# Patient Record
Sex: Female | Born: 1970 | Race: Black or African American | Hispanic: No | Smoking: Current every day smoker
Health system: Southern US, Community
[De-identification: ages and names within clinical notes are randomized; demographics above are authoritative.]

## PROBLEM LIST (undated history)

## (undated) DIAGNOSIS — K219 Gastro-esophageal reflux disease without esophagitis: Secondary | ICD-10-CM

## (undated) DIAGNOSIS — R569 Unspecified convulsions: Secondary | ICD-10-CM

## (undated) DIAGNOSIS — G629 Polyneuropathy, unspecified: Secondary | ICD-10-CM

## (undated) DIAGNOSIS — D649 Anemia, unspecified: Secondary | ICD-10-CM

## (undated) DIAGNOSIS — S069X9A Unspecified intracranial injury with loss of consciousness of unspecified duration, initial encounter: Secondary | ICD-10-CM

## (undated) DIAGNOSIS — E119 Type 2 diabetes mellitus without complications: Secondary | ICD-10-CM

## (undated) DIAGNOSIS — J449 Chronic obstructive pulmonary disease, unspecified: Secondary | ICD-10-CM

## (undated) DIAGNOSIS — S069XAA Unspecified intracranial injury with loss of consciousness status unknown, initial encounter: Secondary | ICD-10-CM

## (undated) HISTORY — PX: KNEE SURGERY: SHX244

---

## 2016-11-13 ENCOUNTER — Emergency Department: Payer: Medicare HMO

## 2016-11-13 ENCOUNTER — Emergency Department
Admission: EM | Admit: 2016-11-13 | Discharge: 2016-11-13 | Disposition: A | Discharge status: Home / Self Care | Payer: Medicare HMO | Attending: Emergency Medicine | Admitting: Emergency Medicine

## 2016-11-13 ENCOUNTER — Encounter: Payer: Self-pay | Admitting: *Deleted

## 2016-11-13 DIAGNOSIS — M25461 Effusion, right knee: Secondary | ICD-10-CM | POA: Diagnosis not present

## 2016-11-13 DIAGNOSIS — M7551 Bursitis of right shoulder: Secondary | ICD-10-CM | POA: Diagnosis not present

## 2016-11-13 DIAGNOSIS — E119 Type 2 diabetes mellitus without complications: Secondary | ICD-10-CM | POA: Diagnosis not present

## 2016-11-13 DIAGNOSIS — M25561 Pain in right knee: Secondary | ICD-10-CM | POA: Diagnosis present

## 2016-11-13 DIAGNOSIS — F17200 Nicotine dependence, unspecified, uncomplicated: Secondary | ICD-10-CM | POA: Insufficient documentation

## 2016-11-13 DIAGNOSIS — J449 Chronic obstructive pulmonary disease, unspecified: Secondary | ICD-10-CM | POA: Diagnosis not present

## 2016-11-13 MED ORDER — NAPROXEN 500 MG PO TABS: 500.0000 mg | ORAL_TABLET | Freq: Two times a day (BID) | ORAL | 0 refills | Status: DC

## 2016-11-13 MED ORDER — NAPROXEN 500 MG PO TABS
500.0000 mg | ORAL_TABLET | Freq: Once | ORAL | Status: AC
  Administered 2016-11-13: 500 mg via ORAL
  Filled 2016-11-13: qty 1

## 2016-11-13 NOTE — ED Triage Notes (Signed)
Pt brought in via ems via wheelchair.  Pt has right knee pain.  No known recent injury.  No swelling.

## 2016-11-13 NOTE — ED Provider Notes (Signed)
[facility] Emergency Department Provider Note  ____________________________________________  Time seen: Approximately 8:18 PM  I have reviewed the triage vital signs and the nursing notes.   HISTORY  Chief Complaint Knee Pain    HPI Shelly Anderson is a 45 y.o. female , NAD, presents to the emergency department with right shoulder and right knee pain. States she has chronic arthritic pain but has noticed that the right knee pain has begun to worsen over the last day. Denies any injury, trauma or fall. Has had previous surgeries but they were in the remote past. Has not been taking anything over-the-counter for her pain. Has not noted any redness, swelling, warmth, skin sores. Denies any numbness, weakness, tingling. Has had no chest pain, shortness breath, abdominal pain, nausea, vomiting, fevers, chills or body aches. Does use a cane for ambulation.   No past medical history on file.  There are no active problems to display for this patient.   No past surgical history on file.  Prior to Admission medications   Medication Sig Start Date End Date Taking? Authorizing Provider  naproxen (NAPROSYN) 500 MG tablet Take 1 tablet (500 mg total) by mouth 2 (two) times daily with a meal. 11/13/16   Kedric Bumgarner L Janijah Symons, PA-C    Allergies Patient has no known allergies.  No family history on file.  Social History Social History  Substance Use Topics  . Smoking status: Current Every Day Smoker  . Smokeless tobacco: Never Used  . Alcohol use No     Review of Systems Constitutional: No fever/chills Cardiovascular: No chest pain. Respiratory:  No shortness of breath.  Gastrointestinal: No abdominal pain.  No nausea, vomiting.  Musculoskeletal: Positive right shoulder and right knee pain. Negative for back pain.  Skin: Negative for rash, redness, swelling, skin sores. Neurological: Negative for Numbness, weakness, tingling. 10-point ROS otherwise  negative.  ____________________________________________   PHYSICAL EXAM:  VITAL SIGNS: ED Triage Vitals  Enc Vitals Group     BP 11/13/16 1937 112/74     Pulse Rate 11/13/16 1937 85     Resp 11/13/16 1937 (!) 2     Temp 11/13/16 1937 98.9 F (37.2 C)     Temp Source 11/13/16 1937 Oral     SpO2 11/13/16 1937 96 %     Weight 11/13/16 1939 200 lb (90.7 kg)     Height 11/13/16 1939 5\' 2"  (1.575 m)     Head Circumference --      Peak Flow --      Pain Score 11/13/16 1939 10     Pain Loc --      Pain Edu? --      Excl. in GC? --      Constitutional: Alert and oriented. Well appearing and in no acute distress. Eyes: Conjunctivae are normal.  Head: Atraumatic. Cardiovascular: Good peripheral circulation with 2+ pulses noted in the right upper and lower extremities. Respiratory: Normal respiratory effort without tachypnea or retractions.  Musculoskeletal: Tenderness to palpation about the proximal, right lateral shoulder correlating with the bursa. Patient has full range of motion of the right shoulder without difficulty but pain with full abduction. Tenderness to palpation about the right medial and superior portion of the knee but with full range of motion. No laxity with anterior posterior drawer of the right knee. No laxity with varus and valgus stress. No lower extremity tenderness nor edema.  No joint effusions. Neurologic:  Normal speech and language. No gross focal neurologic deficits are  appreciated.  Skin:  Skin is warm, dry and intact. No rash, redness, swelling, bruising, open wounds or lacerations noted. Psychiatric: Mood and affect are normal. Speech and behavior are normal. Patient exhibits appropriate insight and judgement.   ____________________________________________   LABS  None ____________________________________________  EKG  None ____________________________________________  RADIOLOGY I, Hope PigeonJami L Tamea Bai, personally viewed and evaluated these images  (plain radiographs) as part of my medical decision making, as well as reviewing the written report by the radiologist.  Dg Knee Complete 4 Views Right  Result Date: 11/13/2016 CLINICAL DATA:  Right knee pain EXAM: RIGHT KNEE - COMPLETE 4+ VIEW COMPARISON:  None. FINDINGS: No fracture or malalignment. Moderate narrowing of the medial and lateral joint compartments. Mild narrowing the patellofemoral compartment. Small superior patellar spurring. Mild medial and lateral spurring. Trace suprapatellar effusion. IMPRESSION: 1. No acute osseous abnormality 2. Moderate arthritic changes 3. Suspect trace suprapatellar effusion Electronically Signed   By: Jasmine PangKim  Fujinaga M.D.   On: 11/13/2016 20:27    ____________________________________________    PROCEDURES  Procedure(s) performed: None   Procedures   Medications  naproxen (NAPROSYN) tablet 500 mg (500 mg Oral Given 11/13/16 2058)     ____________________________________________   INITIAL IMPRESSION / ASSESSMENT AND PLAN / ED COURSE  Pertinent labs & imaging results that were available during my care of the patient were reviewed by me and considered in my medical decision making (see chart for details).  Clinical Course     Patient's diagnosis is consistent with Effusion of right knee and acute bursitis of the right shoulder. Patient was given naproxen in the emergency department and tolerated well. Patient will be discharged home with prescriptions for naproxen to take as directed. Patient is to follow up with Dr. Hyacinth MeekerMiller in orthopedics if symptoms persist past this treatment course. Patient is given ED precautions to return to the ED for any worsening or new symptoms.    ____________________________________________  FINAL CLINICAL IMPRESSION(S) / ED DIAGNOSES  Final diagnoses:  Effusion of right knee  Acute bursitis of right shoulder      NEW MEDICATIONS STARTED DURING THIS VISIT:  Discharge Medication List as of 11/13/2016   8:49 PM    START taking these medications   Details  naproxen (NAPROSYN) 500 MG tablet Take 1 tablet (500 mg total) by mouth 2 (two) times daily with a meal., Starting Thu 11/13/2016, Print             Hope PigeonJami L Labresha Mellor, PA-C 11/13/16 2120    Jeanmarie PlantJames A McShane, MD 11/13/16 2359

## 2016-11-13 NOTE — ED Notes (Signed)
Patient transported to X-ray 

## 2016-11-15 ENCOUNTER — Emergency Department
Admission: EM | Admit: 2016-11-15 | Discharge: 2016-11-15 | Disposition: A | Discharge status: Home / Self Care | Payer: Medicare HMO | Attending: Emergency Medicine | Admitting: Emergency Medicine

## 2016-11-15 ENCOUNTER — Encounter: Payer: Self-pay | Admitting: Emergency Medicine

## 2016-11-15 DIAGNOSIS — M25461 Effusion, right knee: Secondary | ICD-10-CM | POA: Diagnosis not present

## 2016-11-15 DIAGNOSIS — M7551 Bursitis of right shoulder: Secondary | ICD-10-CM | POA: Diagnosis not present

## 2016-11-15 DIAGNOSIS — Z791 Long term (current) use of non-steroidal anti-inflammatories (NSAID): Secondary | ICD-10-CM | POA: Diagnosis not present

## 2016-11-15 DIAGNOSIS — J449 Chronic obstructive pulmonary disease, unspecified: Secondary | ICD-10-CM | POA: Insufficient documentation

## 2016-11-15 DIAGNOSIS — E119 Type 2 diabetes mellitus without complications: Secondary | ICD-10-CM | POA: Diagnosis not present

## 2016-11-15 DIAGNOSIS — F17200 Nicotine dependence, unspecified, uncomplicated: Secondary | ICD-10-CM | POA: Diagnosis not present

## 2016-11-15 DIAGNOSIS — M25561 Pain in right knee: Secondary | ICD-10-CM | POA: Diagnosis present

## 2016-11-15 HISTORY — DX: Chronic obstructive pulmonary disease, unspecified: J44.9

## 2016-11-15 HISTORY — DX: Gastro-esophageal reflux disease without esophagitis: K21.9

## 2016-11-15 HISTORY — DX: Type 2 diabetes mellitus without complications: E11.9

## 2016-11-15 HISTORY — DX: Polyneuropathy, unspecified: G62.9

## 2016-11-15 HISTORY — DX: Anemia, unspecified: D64.9

## 2016-11-15 MED ORDER — KETOROLAC TROMETHAMINE 30 MG/ML IJ SOLN
30.0000 mg | Freq: Once | INTRAMUSCULAR | Status: AC
  Administered 2016-11-15: 30 mg via INTRAMUSCULAR
  Filled 2016-11-15: qty 1

## 2016-11-15 NOTE — ED Provider Notes (Signed)
[facility] Emergency Department Provider Note  ____________________________________________  Time seen: Approximately 5:24 PM  I have reviewed the triage vital signs and the nursing notes.   HISTORY  Chief Complaint Knee Pain    HPI Shelly Anderson is a 45 y.o. female who presents to emergency department complaining of right knee and right shoulder pain. Patient was seen 2 days ago this department for same complaint. Patient was diagnosed with bursitis and right knee pain with small joint effusion. Patient reports that there is been no changes since then but "I don't want pills this time, I want a shot." Patient states that she has had 1 day worth of medications that she has been prescribed. She denies any injury or trauma to area. She denies any radicular symptoms to the upper or lower extremities. Patient states that she is "just here so I can get a shot."   Past Medical History:  Diagnosis Date  . Anemia   . COPD (chronic obstructive pulmonary disease) (HCC)   . Diabetes mellitus without complication (HCC)   . GERD (gastroesophageal reflux disease)   . Neuropathy (HCC)     There are no active problems to display for this patient.   History reviewed. No pertinent surgical history.  Prior to Admission medications   Medication Sig Start Date End Date Taking? Authorizing Provider  naproxen (NAPROSYN) 500 MG tablet Take 1 tablet (500 mg total) by mouth 2 (two) times daily with a meal. 11/13/16   Jami L Hagler, PA-C    Allergies Patient has no known allergies.  No family history on file.  Social History Social History  Substance Use Topics  . Smoking status: Current Every Day Smoker  . Smokeless tobacco: Never Used  . Alcohol use No     Review of Systems  Constitutional: No fever/chills Cardiovascular: no chest pain. Respiratory: no cough. No SOB. Musculoskeletal: Positive for right knee and right shoulder pain Skin: Negative for rash,  abrasions, lacerations, ecchymosis. Neurological: Negative for headaches, focal weakness or numbness. 10-point ROS otherwise negative.  ____________________________________________   PHYSICAL EXAM:  VITAL SIGNS: ED Triage Vitals  Enc Vitals Group     BP 11/15/16 1553 131/79     Pulse Rate 11/15/16 1553 90     Resp 11/15/16 1553 20     Temp 11/15/16 1553 97.9 F (36.6 C)     Temp Source 11/15/16 1553 Oral     SpO2 11/15/16 1553 95 %     Weight 11/15/16 1554 200 lb (90.7 kg)     Height 11/15/16 1554 5\' 2"  (1.575 m)     Head Circumference --      Peak Flow --      Pain Score 11/15/16 1554 6     Pain Loc --      Pain Edu? --      Excl. in GC? --      Constitutional: Alert and oriented. Well appearing and in no acute distress. Eyes: Conjunctivae are normal. PERRL. EOMI. Head: Atraumatic. Neck: No stridor.    Cardiovascular: Normal rate, regular rhythm. Normal S1 and S2.  Good peripheral circulation. Respiratory: Normal respiratory effort without tachypnea or retractions. Lungs CTAB. Good air entry to the bases with no decreased or absent breath sounds. Musculoskeletal: Full range of motion to all extremities. No gross deformities appreciated.Full range of motion to right shoulder. Patient is tender to palpation over the proximal humerus on the lateral aspect. No palpable abnormality. Radial pulse and sensation intact distally. No  visible deformity to the knee but inspection. Full range of motion. Patient is diffusely tender to palpation of the anterior knee. No point tenderness. No palpable abnormality. Varus, valgus, Lachman's is negative. Dorsalis pedis pulse intact. Sensation intact distally. Neurologic:  Normal speech and language. No gross focal neurologic deficits are appreciated.  Skin:  Skin is warm, dry and intact. No rash noted. Psychiatric: Mood and affect are normal. Speech and behavior are normal. Patient exhibits appropriate insight and  judgement.   ____________________________________________   LABS (all labs ordered are listed, but only abnormal results are displayed)  Labs Reviewed - No data to display ____________________________________________  EKG   ____________________________________________  RADIOLOGY   X-ray from 11/13/2016 is reviewed at this time  ____________________________________________    PROCEDURES  Procedure(s) performed:    Procedures    Medications  ketorolac (TORADOL) 30 MG/ML injection 30 mg (30 mg Intramuscular Given 11/15/16 1742)     ____________________________________________   INITIAL IMPRESSION / ASSESSMENT AND PLAN / ED COURSE  Pertinent labs & imaging results that were available during my care of the patient were reviewed by me and considered in my medical decision making (see chart for details).  Review of the [place] CSRS was performed in accordance of the [facility] prior to dispensing any controlled drugs.  Clinical Course     Patient's diagnosis is consistent with Right shoulder bursitis and left knee pain with small effusion. No changes from previous visit. No new trauma. Imaging from previous visit was reviewed. Patient is here "just to get a shot." Toradol shot is given in the emergency department. She will follow-up with orthopedics for further evaluation and management. Patient is to continue prescribed Naprosyn from previous visit..  Patient is given ED precautions to return to the ED for any worsening or new symptoms.     ____________________________________________  FINAL CLINICAL IMPRESSION(S) / ED DIAGNOSES  Final diagnoses:  Bursitis of right shoulder  Acute pain of right knee  Effusion of right knee      NEW MEDICATIONS STARTED DURING THIS VISIT:  New Prescriptions   No medications on file        This chart was dictated using voice recognition software/Dragon. Despite best efforts to proofread, errors can occur which can change the  meaning. Any change was purely unintentional.    Racheal PatchesJonathan D John Williamsen, PA-C 11/15/16 1747    Emily FilbertJonathan E Williams, MD 11/15/16 2257

## 2016-11-15 NOTE — ED Triage Notes (Signed)
C/o  Right knee pain.  Hx of surgery, brace on knee.

## 2016-11-15 NOTE — ED Notes (Signed)
Pt standing at the doorway asking for a nurse. States she wants chocolate milk and sandwich.

## 2016-11-15 NOTE — ED Notes (Signed)
Pt in nad. Here for chronic knee pain. States doesn't want rx, just wants a shot of toradol for pain.

## 2016-11-20 ENCOUNTER — Encounter: Payer: Self-pay | Admitting: *Deleted

## 2016-11-20 ENCOUNTER — Emergency Department
Admission: EM | Admit: 2016-11-20 | Discharge: 2016-11-20 | Disposition: A | Discharge status: Home / Self Care | Payer: Medicare HMO | Attending: Emergency Medicine | Admitting: Emergency Medicine

## 2016-11-20 DIAGNOSIS — M7551 Bursitis of right shoulder: Secondary | ICD-10-CM

## 2016-11-20 DIAGNOSIS — F17200 Nicotine dependence, unspecified, uncomplicated: Secondary | ICD-10-CM | POA: Insufficient documentation

## 2016-11-20 DIAGNOSIS — M25561 Pain in right knee: Secondary | ICD-10-CM

## 2016-11-20 DIAGNOSIS — E119 Type 2 diabetes mellitus without complications: Secondary | ICD-10-CM | POA: Diagnosis not present

## 2016-11-20 DIAGNOSIS — M25511 Pain in right shoulder: Secondary | ICD-10-CM | POA: Diagnosis present

## 2016-11-20 DIAGNOSIS — J449 Chronic obstructive pulmonary disease, unspecified: Secondary | ICD-10-CM | POA: Diagnosis not present

## 2016-11-20 MED ORDER — KETOROLAC TROMETHAMINE 30 MG/ML IJ SOLN
30.0000 mg | Freq: Once | INTRAMUSCULAR | Status: AC
  Administered 2016-11-20: 30 mg via INTRAMUSCULAR
  Filled 2016-11-20: qty 1

## 2016-11-20 NOTE — ED Notes (Signed)
Pt has right knee pain.  Pt wearing a brace.  And pt has right shoulder pain.  No new injury.  Pt states she was seen here recently.

## 2016-11-20 NOTE — ED Notes (Signed)
EMS pt to the lobby via wheelchair, right shoulder and leg pain , pain worsens with "washing dishes" , VSS , from home

## 2016-11-20 NOTE — ED Triage Notes (Signed)
Pt has right knee pain and right shoulder pain.   Pt brought in via ems from home   Pt had surgery on right knee and is wearing brace on right knee.  Pt alert.

## 2016-11-20 NOTE — ED Provider Notes (Signed)
[facility] Emergency Department Provider Note  ____________________________________________  Time seen: Approximately 5:09 PM  I have reviewed the triage vital signs and the nursing notes.   HISTORY  Chief Complaint Knee Pain and Shoulder Pain    HPI Shelly Anderson is a 45 y.o. female who presents to emergency department complaining of right shoulder and right knee pain. Patient presents to the ED via EMS for this complaint. This is the third visit patient has had for this complaint in the last week. Patient states that she has no new injury just "started hurting again." Patient is unsure whether she is taking her prescribed medication from previous visit. Patient states that the pain is the same as previous. Again, no new injury. Patient has not followed up with orthopedics as instructed. Patient is requesting "a shot."   Past Medical History:  Diagnosis Date  . Anemia   . COPD (chronic obstructive pulmonary disease) (HCC)   . Diabetes mellitus without complication (HCC)   . GERD (gastroesophageal reflux disease)   . Neuropathy (HCC)     There are no active problems to display for this patient.   No past surgical history on file.  Prior to Admission medications   Medication Sig Start Date End Date Taking? Authorizing Provider  naproxen (NAPROSYN) 500 MG tablet Take 1 tablet (500 mg total) by mouth 2 (two) times daily with a meal. 11/13/16   Jami L Hagler, PA-C    Allergies Patient has no known allergies.  No family history on file.  Social History Social History  Substance Use Topics  . Smoking status: Current Every Day Smoker  . Smokeless tobacco: Never Used  . Alcohol use No     Review of Systems  Constitutional: No fever/chills Cardiovascular: no chest pain. Respiratory: no cough. No SOB. Musculoskeletal: Positive for right knee and right shoulder pain with Skin: Negative for rash, abrasions, lacerations,  ecchymosis. Neurological: Negative for headaches, focal weakness or numbness. 10-point ROS otherwise negative.  ____________________________________________   PHYSICAL EXAM:  VITAL SIGNS: ED Triage Vitals  Enc Vitals Group     BP 11/20/16 1705 110/67     Pulse Rate 11/20/16 1702 92     Resp 11/20/16 1702 18     Temp 11/20/16 1702 99 F (37.2 C)     Temp Source 11/20/16 1702 Oral     SpO2 11/20/16 1702 99 %     Weight 11/20/16 1703 200 lb (90.7 kg)     Height 11/20/16 1703 5\' 2"  (1.575 m)     Head Circumference --      Peak Flow --      Pain Score 11/20/16 1703 10     Pain Loc --      Pain Edu? --      Excl. in GC? --      Constitutional: Alert and oriented. Well appearing and in no acute distress. Eyes: Conjunctivae are normal. PERRL. EOMI. Head: Atraumatic. Neck: No stridor.    Cardiovascular: Normal rate, regular rhythm. Normal S1 and S2.  Good peripheral circulation. Respiratory: Normal respiratory effort without tachypnea or retractions. Lungs CTAB. Good air entry to the bases with no decreased or absent breath sounds. Musculoskeletal: Full range of motion to all extremities. No gross deformities appreciated.No gross deformities to shoulder knee upon inspection. Equal range of motion right shoulder. Patient is diffusely tender to palpation over the lateral aspect of the shoulder. No palpable abnormality. Patient is tender over the greater trochanteric region. Radial pulse and  sensation intact distally. No visible deformity or edema noted to inspection to the knee. Full range of motion to knee. Patient is diffusely tender to palpation over the anterior aspect of the knee. No palpable abnormality. Dorsalis pedis pulse intact distally. Sensation intact distally. Neurologic:  Normal speech and language. No gross focal neurologic deficits are appreciated.  Skin:  Skin is warm, dry and intact. No rash noted. Psychiatric: Mood and affect are normal. Speech and behavior are normal.  Patient exhibits appropriate insight and judgement.   ____________________________________________   LABS (all labs ordered are listed, but only abnormal results are displayed)  Labs Reviewed - No data to display ____________________________________________  EKG   ____________________________________________  RADIOLOGY   X-ray is reviewed from 11/13/2016  ____________________________________________    PROCEDURES  Procedure(s) performed:    Procedures    Medications  ketorolac (TORADOL) 30 MG/ML injection 30 mg (not administered)     ____________________________________________   INITIAL IMPRESSION / ASSESSMENT AND PLAN / ED COURSE  Pertinent labs & imaging results that were available during my care of the patient were reviewed by me and considered in my medical decision making (see chart for details).  Review of the [place] CSRS was performed in accordance of the [facility] prior to dispensing any controlled drugs.  Clinical Course     Patient's diagnosis is consistent with Continued right shoulder bursitis and knee pain. This is the third visit for this complaint in the last week. Patient presents emergency department via EMS for this complaint. No new injury. Exam is reassuring. No indication for nuclear imaging. Patient is informed that she should follow-up with orthopedics  no new prescriptions are prescribed this time. Patient is given ED precautions to return to the ED for any worsening or new symptoms.     ____________________________________________  FINAL CLINICAL IMPRESSION(S) / ED DIAGNOSES  Final diagnoses:  Bursitis of right shoulder  Acute pain of right knee      NEW MEDICATIONS STARTED DURING THIS VISIT:  New Prescriptions   No medications on file        This chart was dictated using voice recognition software/Dragon. Despite best efforts to proofread, errors can occur which can change the meaning. Any change was purely  unintentional.    Racheal PatchesJonathan D Zackery Brine, PA-C 11/20/16 1713    Loleta Roseory Forbach, MD 11/20/16 1742

## 2016-11-28 MED ORDER — ACETAMINOPHEN 500 MG PO TABS
ORAL_TABLET | ORAL | Status: AC
  Filled 2016-11-28: qty 2

## 2016-11-30 ENCOUNTER — Emergency Department: Payer: Medicare HMO

## 2016-11-30 ENCOUNTER — Encounter: Payer: Self-pay | Admitting: Medical Oncology

## 2016-11-30 ENCOUNTER — Emergency Department
Admission: EM | Admit: 2016-11-30 | Discharge: 2016-11-30 | Disposition: A | Discharge status: Home / Self Care | Payer: Medicare HMO | Attending: Emergency Medicine | Admitting: Emergency Medicine

## 2016-11-30 DIAGNOSIS — Y929 Unspecified place or not applicable: Secondary | ICD-10-CM | POA: Diagnosis not present

## 2016-11-30 DIAGNOSIS — W19XXXA Unspecified fall, initial encounter: Secondary | ICD-10-CM

## 2016-11-30 DIAGNOSIS — J449 Chronic obstructive pulmonary disease, unspecified: Secondary | ICD-10-CM | POA: Diagnosis not present

## 2016-11-30 DIAGNOSIS — Y9389 Activity, other specified: Secondary | ICD-10-CM | POA: Diagnosis not present

## 2016-11-30 DIAGNOSIS — E119 Type 2 diabetes mellitus without complications: Secondary | ICD-10-CM | POA: Diagnosis not present

## 2016-11-30 DIAGNOSIS — F17200 Nicotine dependence, unspecified, uncomplicated: Secondary | ICD-10-CM | POA: Insufficient documentation

## 2016-11-30 DIAGNOSIS — W0110XA Fall on same level from slipping, tripping and stumbling with subsequent striking against unspecified object, initial encounter: Secondary | ICD-10-CM | POA: Insufficient documentation

## 2016-11-30 DIAGNOSIS — Z791 Long term (current) use of non-steroidal anti-inflammatories (NSAID): Secondary | ICD-10-CM | POA: Diagnosis not present

## 2016-11-30 DIAGNOSIS — H538 Other visual disturbances: Secondary | ICD-10-CM | POA: Diagnosis not present

## 2016-11-30 DIAGNOSIS — S0990XA Unspecified injury of head, initial encounter: Secondary | ICD-10-CM | POA: Diagnosis present

## 2016-11-30 DIAGNOSIS — Y999 Unspecified external cause status: Secondary | ICD-10-CM | POA: Diagnosis not present

## 2016-11-30 NOTE — ED Provider Notes (Signed)
[facility] Emergency Department Provider Note  ____________________________________________   First MD Initiated Contact with Patient 11/30/16 1345     (approximate)  I have reviewed the triage vital signs and the nursing notes.   HISTORY  Chief Complaint Fall    HPI Shelly Anderson is a 45 y.o. female accompanied by her daughter. Patient states that she fell while getting out of the shower today. She states that she hit her head but did not lose consciousness. Since the fall, she has experienced blurry vision but no nausea, vomiting or disorientation. According to patient's daughter, patient has a history of seizure disorder, suicide attempt and traumatic brain injury. Patient is not currently living with her family. Patient's daughter is paying for her to stay in a hotel. Patient does not currently have a headache.   Past Medical History:  Diagnosis Date  . Anemia   . COPD (chronic obstructive pulmonary disease) (HCC)   . Diabetes mellitus without complication (HCC)   . GERD (gastroesophageal reflux disease)   . Neuropathy (HCC)     There are no active problems to display for this patient.   History reviewed. No pertinent surgical history.  Prior to Admission medications   Medication Sig Start Date End Date Taking? Authorizing Provider  naproxen (NAPROSYN) 500 MG tablet Take 1 tablet (500 mg total) by mouth 2 (two) times daily with a meal. 11/13/16   Jami L Hagler, PA-C    Allergies Patient has no known allergies.  No family history on file.  Social History Social History  Substance Use Topics  . Smoking status: Current Every Day Smoker  . Smokeless tobacco: Never Used  . Alcohol use No    Review of Systems *Constitutional: No fever/chills Eyes: Blurry vision Cardiovascular: Denies chest pain. Respiratory: Denies shortness of breath. Gastrointestinal: No abdominal pain.  No nausea, no vomiting.  No diarrhea.  No  constipation. Musculoskeletal: Negative for back pain. Skin: Negative for rash. Neurological: Negative for headaches, focal weakness or numbness.  10-point ROS otherwise negative.  ____________________________________________   PHYSICAL EXAM:  VITAL SIGNS: ED Triage Vitals  Enc Vitals Group     BP 11/30/16 1208 113/79     Pulse Rate 11/30/16 1208 85     Resp 11/30/16 1208 16     Temp 11/30/16 1208 98.8 F (37.1 C)     Temp Source 11/30/16 1208 Oral     SpO2 11/30/16 1208 94 %     Weight 11/30/16 1159 200 lb (90.7 kg)     Height 11/30/16 1159 5\' 2"  (1.575 m)     Head Circumference --      Peak Flow --      Pain Score 11/30/16 1159 5     Pain Loc --      Pain Edu? --      Excl. in GC? --    Constitutional: Alert. Well appearing and in no acute distress. Eyes: Conjunctivae are normal. PERRL. EOMI.  Head: Atraumatic.No bruising or erythema visualized.  Nose: Nasal septum midline. No evidence of hematoma formation. Mouth/Throat: Mucous membranes are moist.  Oropharynx non-erythematous. Neck: Full range of motion. No tenderness along the C-spine. Cardiovascular: Normal rate, regular rhythm. Grossly normal heart sounds.  Good peripheral circulation. Respiratory: Normal respiratory effort.  No retractions. Lungs CTAB. Gastrointestinal: Soft and nontender. No distention. Neurologic: Normal speech and language. No gross focal neurologic deficits are appreciated. Cranial nerves: 2-10 normal as tested. Cerebellar: Finger-nose-finger WNL, heel to shin WNL. Sensorimotor: No pronator  drift, clonus, sensory loss or abnormal reflexes. Vision: No visual field deficts noted to confrontation.  Speech: No dysarthria or expressive aphasia.  Skin:  Skin is warm, dry and intact. No rash noted. Psychiatric: Patient has delay with answering questions.  ____________________________________________   LABS (all labs ordered are listed, but only abnormal results are displayed)  Labs Reviewed -  No data to display ____________________________________________   RADIOLOGY  I, Orvil FeilJaclyn M Batu Cassin, personally viewed and evaluated these images (plain radiographs) as part of my medical decision making, as well as reviewing the written report by the radiologist.    IMPRESSION:  1. No evidence of acute intracranial abnormality. No evidence of  calvarial fracture.  2. No cervical spine fracture or subluxation.    Procedures None    ____________________________________________   INITIAL IMPRESSION / ASSESSMENT AND PLAN / ED COURSE  Pertinent labs & imaging results that were available during my care of the patient were reviewed by me and considered in my medical decision making (see chart for details).   Clinical Course    Assessment and plan: Fall: Due to patient's complex past medical history consisting of a seizure disorder and traumatic brain injury, I felt a CT head and neck was warranted. No acute abnormalities were visualized on CT. Patient presents with no other complaints. Vital signs are reassuring at this time. Strict return precautions were given. All patient questions were answered. Tylenol was advised to be used as needed for discomfort. ____________________________________________   FINAL CLINICAL IMPRESSION(S) / ED DIAGNOSES  Final diagnoses:  Fall, initial encounter      NEW MEDICATIONS STARTED DURING THIS VISIT:  Discharge Medication List as of 11/30/2016  1:46 PM       Note:  This document was prepared using Dragon voice recognition software and may include unintentional dictation errors.    Orvil FeilJaclyn M Kieran Nachtigal, PA-C 11/30/16 1702    Sharyn CreamerMark Quale, MD 12/07/16 Burna Mortimer0010

## 2016-11-30 NOTE — NC FL2 (Signed)
  [place] MEDICAID FL2 LEVEL OF CARE SCREENING TOOL     IDENTIFICATION  Patient Name: Shelly AsalChristina D Deterding Birthdate: [birth year] Sex: female Admission Date (Current Location): 11/30/2016  [place]   Facility and Address:  [facility], [address], [place], [place] [address]      Provider Number: [id number]  Attending Physician Name and Address:  Sharyn CreamerMark Quale, MD  Relative Name and Phone Number:       Current Level of Care: Hospital Recommended Level of Care: Family Care Home Prior Approval Number:    Date Approved/Denied:   PASRR Number:    Discharge Plan: Other (Comment) (shelter)    Current Diagnoses: There are no active problems to display for this patient.   Orientation RESPIRATION BLADDER Height & Weight     Self, Time, Situation, Place  Normal Continent Weight: 200 lb (90.7 kg) Height:  5\' 2"  (157.5 cm)  BEHAVIORAL SYMPTOMS/MOOD NEUROLOGICAL BOWEL NUTRITION STATUS      Continent Diet (normal)  AMBULATORY STATUS COMMUNICATION OF NEEDS Skin   Independent (uses cane) Verbally Normal                       Personal Care Assistance Level of Assistance  Bathing, Feeding, Dressing, Total care Bathing Assistance: Independent Feeding assistance: Independent Dressing Assistance: Independent Total Care Assistance: Independent   Functional Limitations Info  Sight, Hearing, Speech Sight Info: Adequate Hearing Info: Adequate Speech Info: Adequate    SPECIAL CARE FACTORS FREQUENCY                       Contractures      Additional Factors Info  Code Status Code Status Info: Full             Current Medications (11/30/2016):  This is the current hospital active medication list No current facility-administered medications for this encounter.    Current Outpatient Prescriptions  Medication Sig Dispense Refill  . naproxen (NAPROSYN) 500 MG tablet Take 1 tablet (500 mg total) by mouth 2  (two) times daily with a meal. 14 tablet 0     Discharge Medications: Please see discharge summary for a list of discharge medications.  Relevant Imaging Results:  Relevant Lab Results:   Additional Information SSN # [id number][phone]  Tomie Spizzirri, [place] M, [place]

## 2016-11-30 NOTE — ED Triage Notes (Signed)
Pt slipped and fell getting out of shower, pt has TBI x 10 years and daughter concerned just wanting pt to be checked, per daughter pt acting baseline. Pt is not on blood thinner.

## 2016-11-30 NOTE — Progress Notes (Signed)
LCSW provided family with a list of [facility] and completed assessment and FL2 Patient has  APS worker coming tomorrow to meet with family and patient to assist with guardianship and home safety concerns and family issues.  Spoke to Dr Fanny BienQuale that Lavenia AtlasIve requested a signature to assist the [name]. He will review and sign.  LCSW spoke to ED Nurse and he will put signed copy of Fl2 in discharge summary  No further SW needs at this time.   [name] LCSW [phone]

## 2016-11-30 NOTE — Clinical Social Work Note (Addendum)
Clinical Social Work Assessment  Patient Details  Name: Shelly AsalChristina D Anderson MRN: [id number] Date of Birth: Jul 09, 1971  Date of referral:  11/30/16               Reason for consult:  Housing Concerns/Homelessness                Permission sought to share information with:  Family Supports, Guardian, Magazine features editoracility Contact Representative Permission granted to share information::  Yes, Verbal Permission Granted  Name::     Son/Corey [phone]  Agency::     Relationship::     Contact Information:     Housing/Transportation Living arrangements for the past 2 months:   (with family couch surfing) Source of Information:  Patient, Adult Children Patient Interpreter Needed:  None Criminal Activity/Legal Involvement Pertinent to Current Situation/Hospitalization:  No - Comment as needed Significant Relationships:  Adult Children Lives with:  Adult Children Do you feel safe going back to the place where you live?  Yes Need for family participation in patient care:  Yes (Comment)  Care giving concerns: [facility]   Social Worker assessment / plan: LCSW and patient was introduced. Patient gave verbal consent to talk to daughter/son and facilities if required. It was suggested that patient has no where to go and there was a recent family issue with false accusations. Patient has Schizophrenia,depression,PTSD and has to use a cane and has rotator cup issues. Patient is verbal but speech is mabled Patient is legally separated from her husband who is out of state. LCSW provided family with shelter information, List of family care facilities and [employer] resource list. Patient has an APS worker coming to the house over family issues and home safety concerns and guardianship.  Employment status:  Disabled (Comment on whether or not currently receiving Disability) ([employer]) Insurance information:  Furn[employer] conservator/restorerMedicare ([employer]) PT Recommendations:    Information / Referral to  community resources:     Patient/Family's Response to care:  Patient came in because of a fall  Patient/Family's Understanding of and Emotional Response to Diagnosis, Current Treatment, and Prognosis:  LCSW reviewed at length the process and will assist family with pts Fl2 for future placements  Emotional Assessment Appearance:  Appears stated age Attitude/Demeanor/Rapport:   (Calm- informed) Affect (typically observed):  Appropriate, Calm Orientation:  Oriented to Self, Oriented to Place, Oriented to  Time, Oriented to Situation Alcohol / Substance use:  Not Applicable Psych involvement (Current and /or in the community):   ([place] invol[place] in [place])  Discharge Needs  Concerns to be addressed:  Homelessness Readmission within the last 30 days:  No Current discharge risk:  Homeless, Psychiatric Illness, Physical Impairment Barriers to Discharge:  Continued Medical Work up, [employer] AutomotiveFamily Issues   Tashika Goodin, Adrianlaud[place] M, LCSW 11/30/2016, 1:42 PM

## 2016-12-10 ENCOUNTER — Other Ambulatory Visit: Payer: Self-pay | Admitting: Family Medicine

## 2016-12-10 ENCOUNTER — Ambulatory Visit
Admission: RE | Admit: 2016-12-10 | Discharge: 2016-12-10 | Disposition: A | Discharge status: Home / Self Care | Payer: Medicare HMO | Source: Ambulatory Visit | Attending: Family Medicine | Admitting: Family Medicine

## 2016-12-10 DIAGNOSIS — M25561 Pain in right knee: Secondary | ICD-10-CM

## 2016-12-10 DIAGNOSIS — M25562 Pain in left knee: Principal | ICD-10-CM

## 2016-12-10 DIAGNOSIS — M170 Bilateral primary osteoarthritis of knee: Secondary | ICD-10-CM | POA: Diagnosis not present

## 2016-12-10 DIAGNOSIS — G8929 Other chronic pain: Secondary | ICD-10-CM | POA: Diagnosis present

## 2017-08-21 ENCOUNTER — Emergency Department
Admission: EM | Admit: 2017-08-21 | Discharge: 2017-08-21 | Disposition: A | Discharge status: Home / Self Care | Payer: Medicare HMO

## 2017-08-21 NOTE — ED Triage Notes (Signed)
Called for [employer] Holdingsraige x1. Patient not in lobby

## 2017-08-25 ENCOUNTER — Telehealth: Payer: Self-pay | Admitting: Emergency Medicine

## 2017-08-25 NOTE — Telephone Encounter (Addendum)
Called patient due to lwot to inquire about condition and follow up plans. Left message with person who answered the phone.  She took my number.  Pt called me back.  She says she has not had anymore seizures.  I told her she could always return if needed.  She says she lives in a group home.

## 2017-09-02 ENCOUNTER — Other Ambulatory Visit: Payer: Self-pay | Admitting: Orthopedic Surgery

## 2017-09-02 ENCOUNTER — Ambulatory Visit
Admission: RE | Admit: 2017-09-02 | Discharge: 2017-09-02 | Disposition: A | Discharge status: Home / Self Care | Payer: Medicare HMO | Source: Ambulatory Visit | Attending: Orthopedic Surgery | Admitting: Orthopedic Surgery

## 2017-09-02 DIAGNOSIS — M25511 Pain in right shoulder: Principal | ICD-10-CM

## 2017-09-02 DIAGNOSIS — G8929 Other chronic pain: Secondary | ICD-10-CM

## 2017-09-02 DIAGNOSIS — M7531 Calcific tendinitis of right shoulder: Secondary | ICD-10-CM | POA: Insufficient documentation

## 2018-01-22 ENCOUNTER — Ambulatory Visit
Admission: RE | Admit: 2018-01-22 | Discharge: 2018-01-22 | Disposition: A | Discharge status: Home / Self Care | Payer: Medicare HMO | Source: Ambulatory Visit | Attending: Family Medicine | Admitting: Family Medicine

## 2018-01-22 ENCOUNTER — Emergency Department: Payer: Medicare HMO

## 2018-01-22 ENCOUNTER — Other Ambulatory Visit: Payer: Self-pay

## 2018-01-22 ENCOUNTER — Encounter: Payer: Self-pay | Admitting: Emergency Medicine

## 2018-01-22 ENCOUNTER — Other Ambulatory Visit: Payer: Self-pay | Admitting: Family Medicine

## 2018-01-22 ENCOUNTER — Emergency Department
Admission: EM | Admit: 2018-01-22 | Discharge: 2018-01-22 | Disposition: A | Discharge status: Home / Self Care | Payer: Medicare HMO | Attending: Emergency Medicine | Admitting: Emergency Medicine

## 2018-01-22 DIAGNOSIS — Y998 Other external cause status: Secondary | ICD-10-CM | POA: Insufficient documentation

## 2018-01-22 DIAGNOSIS — S43034A Inferior dislocation of right humerus, initial encounter: Secondary | ICD-10-CM

## 2018-01-22 DIAGNOSIS — S43111A Subluxation of right acromioclavicular joint, initial encounter: Secondary | ICD-10-CM | POA: Diagnosis not present

## 2018-01-22 DIAGNOSIS — Y929 Unspecified place or not applicable: Secondary | ICD-10-CM | POA: Insufficient documentation

## 2018-01-22 DIAGNOSIS — F17200 Nicotine dependence, unspecified, uncomplicated: Secondary | ICD-10-CM | POA: Diagnosis not present

## 2018-01-22 DIAGNOSIS — Z79899 Other long term (current) drug therapy: Secondary | ICD-10-CM | POA: Insufficient documentation

## 2018-01-22 DIAGNOSIS — Y9389 Activity, other specified: Secondary | ICD-10-CM | POA: Insufficient documentation

## 2018-01-22 DIAGNOSIS — S43001A Unspecified subluxation of right shoulder joint, initial encounter: Secondary | ICD-10-CM

## 2018-01-22 DIAGNOSIS — M25511 Pain in right shoulder: Secondary | ICD-10-CM

## 2018-01-22 DIAGNOSIS — X58XXXA Exposure to other specified factors, initial encounter: Secondary | ICD-10-CM

## 2018-01-22 DIAGNOSIS — J449 Chronic obstructive pulmonary disease, unspecified: Secondary | ICD-10-CM | POA: Diagnosis not present

## 2018-01-22 DIAGNOSIS — S4991XA Unspecified injury of right shoulder and upper arm, initial encounter: Secondary | ICD-10-CM | POA: Diagnosis present

## 2018-01-22 MED ORDER — MORPHINE SULFATE (PF) 4 MG/ML IV SOLN
4.0000 mg | Freq: Once | INTRAVENOUS | Status: AC
  Administered 2018-01-22: 4 mg via INTRAVENOUS
  Filled 2018-01-22: qty 1

## 2018-01-22 MED ORDER — PROPOFOL 10 MG/ML IV BOLUS: 0.5000 mg/kg | Freq: Once | INTRAVENOUS | Status: DC

## 2018-01-22 MED ORDER — PROPOFOL 10 MG/ML IV BOLUS: 1.0000 mg/kg | Freq: Once | INTRAVENOUS | Status: DC

## 2018-01-22 MED ORDER — KETOROLAC TROMETHAMINE 30 MG/ML IJ SOLN
30.0000 mg | Freq: Once | INTRAMUSCULAR | Status: AC
  Administered 2018-01-22: 30 mg via INTRAVENOUS

## 2018-01-22 MED ORDER — PROPOFOL 10 MG/ML IV BOLUS
0.5000 mg/kg | Freq: Once | INTRAVENOUS | Status: DC
Start: 2018-01-22 — End: 2018-01-22

## 2018-01-22 MED ORDER — OXYCODONE-ACETAMINOPHEN 5-325 MG PO TABS: 1.0000 | ORAL_TABLET | Freq: Four times a day (QID) | ORAL | 0 refills | Status: AC | PRN

## 2018-01-22 MED ORDER — PROPOFOL 10 MG/ML IV BOLUS
INTRAVENOUS | Status: AC | PRN
  Administered 2018-01-22: 80 mg via INTRAVENOUS

## 2018-01-22 MED ORDER — PROPOFOL 10 MG/ML IV BOLUS
40.0000 mg | Freq: Once | INTRAVENOUS | Status: DC
  Filled 2018-01-22: qty 20

## 2018-01-22 MED ORDER — KETOROLAC TROMETHAMINE 30 MG/ML IJ SOLN
INTRAMUSCULAR | Status: AC
  Administered 2018-01-22: 30 mg via INTRAVENOUS
  Filled 2018-01-22: qty 1

## 2018-01-22 MED ORDER — PROPOFOL 10 MG/ML IV BOLUS
80.0000 mg | Freq: Once | INTRAVENOUS | Status: AC
  Administered 2018-01-22: 80 mg via INTRAVENOUS

## 2018-01-22 NOTE — ED Notes (Signed)
Moderate sedation discharge instructions were included in discharge instructions from Dr. Marisa SeverinSiadecki. Patient signed moderate sedation discharge instructions paper that was witnessed by this Clinical research associatewriter and a copy was sent home with the patient.

## 2018-01-22 NOTE — ED Notes (Signed)
Patient called group home for a ride. This Clinical research associatewriter also called the group home who verified that someone was on their way here.

## 2018-01-22 NOTE — ED Notes (Addendum)
Right shoulder immobilizer placed by ED tech Beth. Patient given Ed sandwich tray.

## 2018-01-22 NOTE — ED Provider Notes (Signed)
[facility] Emergency Department Provider Note ____________________________________________   First MD Initiated Contact with Patient 01/22/18 1515     (approximate)  I have reviewed the triage vital signs and the nursing notes.   HISTORY  Chief Complaint Shoulder Pain    HPI Arnell AsalChristina D Gunderson is a 47 y.o. female with past medical history as noted below including a history of schizophrenia who presents with right shoulder pain over the last 2-3 days, atraumatic, constant, worse with any attempted movement, feeling similar to when she has dislocated her shoulder in the past.  Patient denies any numbness or weakness.  No other pain in other joints.  Past Medical History:  Diagnosis Date  . Anemia   . COPD (chronic obstructive pulmonary disease) (HCC)   . Diabetes mellitus without complication (HCC)   . GERD (gastroesophageal reflux disease)   . Neuropathy     There are no active problems to display for this patient.   History reviewed. No pertinent surgical history.  Prior to Admission medications   Medication Sig Start Date End Date Taking? Authorizing Provider  acetaminophen (TYLENOL) 650 MG CR tablet Take 650 mg by mouth every 8 (eight) hours as needed for pain.   Yes [provider]  atorvastatin (LIPITOR) 10 MG tablet Take 10 mg by mouth daily at 6 PM.   Yes [provider]  calcium carbonate (TUMS - DOSED IN MG ELEMENTAL CALCIUM) 500 MG chewable tablet Chew 1 tablet by mouth daily.   Yes [provider]  clotrimazole (LOTRIMIN) 1 % cream Apply 1 application topically 2 (two) times daily.   Yes [provider]  cyclobenzaprine (FLEXERIL) 10 MG tablet Take 10 mg by mouth at bedtime as needed for muscle spasms.   Yes [provider]  divalproex (DEPAKOTE) 250 MG DR tablet Take 250 mg by mouth 2 (two) times daily.   Yes [provider]  divalproex (DEPAKOTE) 500 MG DR tablet Take 500-1,000 mg by  mouth 2 (two) times daily.   Yes [provider]  DULoxetine (CYMBALTA) 60 MG capsule Take 60 mg by mouth daily.   Yes [provider]  fenofibrate (TRICOR) 145 MG tablet Take 145 mg by mouth daily.   Yes [provider]  fluticasone (FLONASE) 50 MCG/ACT nasal spray Place 1 spray into both nostrils daily.   Yes [provider]  hydrOXYzine (ATARAX/VISTARIL) 10 MG tablet Take 10 mg by mouth 3 (three) times daily as needed for itching.    Yes [provider]  hydrOXYzine (VISTARIL) 25 MG capsule Take 25 mg by mouth at bedtime.   Yes [provider]  ketotifen (ZADITOR) 0.025 % ophthalmic solution Place 1 drop into both eyes 2 (two) times daily as needed.   Yes [provider]  LORazepam (ATIVAN) 0.5 MG tablet Take 0.5 mg by mouth 2 (two) times daily.   Yes [provider]  LORazepam (ATIVAN) 0.5 MG tablet Take 0.5 mg by mouth daily as needed for anxiety.   Yes [provider]  Melatonin 3 MG TABS Take 3 mg by mouth daily.   Yes [provider]  meloxicam (MOBIC) 15 MG tablet Take 15 mg by mouth daily.   Yes [provider]  mirtazapine (REMERON) 7.5 MG tablet Take 7.5 mg by mouth at bedtime.   Yes [provider]  polyethylene glycol (MIRALAX / GLYCOLAX) packet Take 17 g by mouth daily as needed for mild constipation.    Yes [provider]  QUEtiapine (SEROQUEL XR) 300 MG 24 hr tablet Take 300 mg by mouth 2 (two) times daily.   Yes [provider]  naproxen (NAPROSYN) 500 MG tablet Take 1 tablet (500 mg total) by mouth 2 (two) times daily with a meal. Patient not taking: Reported on 01/22/2018 11/13/16   Hagler, Jami L, PA-C  oxyCODONE-acetaminophen (PERCOCET) 5-325 MG tablet Take 1 tablet by mouth every 6 (six) hours as needed for up to 3 days for severe pain. 01/22/18 01/25/18  Dionne Bucy, MD    Allergies Patient has no known allergies.  History reviewed. No  pertinent family history.  Social History Social History   Tobacco Use  . Smoking status: Current Every Day Smoker  . Smokeless tobacco: Never Used  Substance Use Topics  . Alcohol use: No  . Drug use: Not on file    Review of Systems  Constitutional: No fever. Eyes: No redness. ENT: No neck pain. Cardiovascular: Denies chest pain. Respiratory: Denies shortness of breath. Gastrointestinal: No abdominal pain.  Genitourinary: Negative for flank pain.  Musculoskeletal: Negative for back pain.  Positive for right shoulder pain. Skin: Negative for rash. Neurological: Negative for focal weakness or numbness.   ____________________________________________   PHYSICAL EXAM:  VITAL SIGNS: ED Triage Vitals  Enc Vitals Group     BP 01/22/18 1452 (!) 113/91     Pulse Rate 01/22/18 1452 96     Resp --      Temp 01/22/18 1452 98.5 F (36.9 C)     Temp Source 01/22/18 1452 Oral     SpO2 01/22/18 1452 97 %     Weight 01/22/18 1458 190 lb (86.2 kg)     Height 01/22/18 1458 5\' 2"  (1.575 m)     Head Circumference --      Peak Flow --      Pain Score 01/22/18 1458 8     Pain Loc --      Pain Edu? --      Excl. in GC? --     Constitutional: Alert and oriented. Well appearing and in no acute distress. Eyes: Conjunctivae are normal.  Head: Atraumatic. Nose: No congestion/rhinnorhea. Mouth/Throat: Mucous membranes are moist.   Neck: Normal range of motion.  Old tracheostomy scar present. Cardiovascular: Normal rate, regular rhythm. Grossly normal heart sounds.  Good peripheral circulation. Respiratory: Normal respiratory effort.  No retractions. Lungs CTAB. Gastrointestinal: No distention.  Genitourinary: No CVA tenderness. Musculoskeletal: No lower extremity edema.  Extremities warm and well perfused.  2+ DP pulse RUE.  FR OM right elbow and wrist.  Pain on any range of motion of right shoulder, and slight deformity to right shoulder. Neurologic:  Normal speech and language. No  gross focal neurologic deficits are appreciated.  5 out of 5 grip strength, and motor and sensory intact in median/radial/ulnar distributions of right upper extremity. Skin:  Skin is warm and dry. No rash noted. Psychiatric: Mood and affect are normal. Speech and behavior are normal.  ____________________________________________   LABS (all labs ordered are listed, but only abnormal results are displayed)  Labs Reviewed - No data to display ____________________________________________  EKG   ____________________________________________  RADIOLOGY  XR right shoulder: Subluxation of right shoulder joint  ____________________________________________   PROCEDURES  Procedure(s) performed: Yes  Reduction of dislocation Date/Time: 01/22/2018 6:12 PM Performed by: Dionne Bucy, MD Authorized by: Dionne Bucy, MD  Consent: Written consent obtained. Consent given by: patient Patient understanding: patient states understanding of the procedure being performed Patient consent: the  patient's understanding of the procedure matches consent given Procedure consent: procedure consent matches procedure scheduled Imaging studies: imaging studies available Required items: required blood products, implants, devices, and special equipment available Patient identity confirmed: verbally with patient and arm band Time out: Immediately prior to procedure a "time out" was called to verify the correct patient, procedure, equipment, support staff and site/side marked as required. Local anesthesia used: no  Anesthesia: Local anesthesia used: no  Sedation: Patient sedated: yes Sedatives: propofol Sedation start date/time: 01/22/2018 5:43 PM Vitals: Vital signs were monitored during sedation.  Patient tolerance: Patient tolerated the procedure well with no immediate complications  .Sedation Date/Time: 01/22/2018 6:14 PM Performed by: Dionne Bucy, MD Authorized by: Dionne Bucy, MD   Consent:    Consent obtained:  Written   Consent given by:  Patient   Risks discussed:  Prolonged hypoxia resulting in organ damage, prolonged sedation necessitating reversal, inadequate sedation and respiratory compromise necessitating ventilatory assistance and intubation Universal protocol:    Immediately prior to procedure a time out was called: yes   Indications:    Procedure performed:  Dislocation reduction   Procedure necessitating sedation performed by:  Physician performing sedation   Intended level of sedation:  Moderate (conscious sedation) Pre-sedation assessment:    Time since last food or drink:  6 hours   ASA classification: class 2 - patient with mild systemic disease     Neck mobility: normal     Mouth opening:  3 or more finger widths   Mallampati score:  II - soft palate, uvula, fauces visible   Pre-sedation assessments completed and reviewed: airway patency, mental status and respiratory function   Immediate pre-procedure details:    Reviewed: relevant labs/tests     Verified: bag valve mask available, emergency equipment available, intubation equipment available, oxygen available and suction available   Procedure details (see MAR for exact dosages):    Preoxygenation:  Nasal cannula   Sedation:  Propofol   Intra-procedure monitoring:  Continuous capnometry, blood pressure monitoring, cardiac monitor and continuous pulse oximetry   Intra-procedure events: none     Total Provider sedation time (minutes):  5 Post-procedure details:    Attendance: Constant attendance by certified staff until patient recovered     Recovery: Patient returned to pre-procedure baseline     Patient is stable for discharge or admission: yes     Patient tolerance:  Tolerated well, no immediate complications   Reduction of dislocation (Second attempt) Date/Time: 01/22/2018 8:15 PM Performed by: Dionne Bucy, MD Authorized by: Dionne Bucy, MD  Consent:  Written consent obtained. Consent given by: patient Patient understanding: patient states understanding of the procedure being performed Patient consent: the patient's understanding of the procedure matches consent given Procedure consent: procedure consent matches procedure scheduled Imaging studies: imaging studies available Required items: required blood products, implants, devices, and special equipment available Patient identity confirmed: verbally with patient and arm band Time out: Immediately prior to procedure a "time out" was called to verify the correct patient, procedure, equipment, support staff and site/side marked as required. Local anesthesia used: no  Anesthesia: Local anesthesia used: no  Sedation: Patient sedated: yes Sedatives: propofol Sedation start date/time: 01/22/2018 8:15 PM Vitals: Vital signs were monitored during sedation.  Patient tolerance: Patient tolerated the procedure well with no immediate complications  .Sedation (Second attempt) Date/Time: 01/22/2018 8:15 PM Performed by: Dionne Bucy, MD Authorized by: Dionne Bucy, MD   Consent:    Consent obtained:  Written   Consent given by:  Patient   Risks discussed:  Prolonged hypoxia resulting in organ damage, prolonged sedation necessitating reversal, inadequate sedation and respiratory compromise necessitating ventilatory assistance and intubation Indications:    Procedure performed:  Dislocation reduction   Procedure necessitating sedation performed by:  Physician performing sedation   Intended level of sedation:  Moderate (conscious sedation) Pre-sedation assessment:    Time since last food or drink:  6 hours   ASA classification: class 2 - patient with mild systemic disease     Neck mobility: normal     Mouth opening:  3 or more finger widths   Mallampati score:  II - soft palate, uvula, fauces visible   Pre-sedation assessments completed and reviewed: airway patency, mental status  and respiratory function   Immediate pre-procedure details:    Reviewed: relevant labs/tests     Verified: bag valve mask available, emergency equipment available, intubation equipment available, oxygen available and suction available   Procedure details (see MAR for exact dosages):    Preoxygenation:  Nasal cannula   Sedation:  Propofol   Intra-procedure monitoring:  Continuous capnometry, blood pressure monitoring, cardiac monitor and continuous pulse oximetry   Intra-procedure events: none     Total Provider sedation time (minutes):  5 Post-procedure details:    Attendance: Constant attendance by certified staff until patient recovered     Recovery: Patient returned to pre-procedure baseline     Patient is stable for discharge or admission: yes     Patient tolerance:  Tolerated well, no immediate complications     Critical Care performed: No ____________________________________________   INITIAL IMPRESSION / ASSESSMENT AND PLAN / ED COURSE  Pertinent labs & imaging results that were available during my care of the patient were reviewed by me and considered in my medical decision making (see chart for details).  47 year old female with past medical history as noted above presents with atraumatic right shoulder pain which she states is similar to prior shoulder dislocations.  She reports chronic pain in the right shoulder.  On exam, the patient has severe pain on any attempted range of motion of the right shoulder, and there is a deformity consistent with possible anterior dislocation.  The right upper extremity is neuro/vascular intact.  Patient has old tracheotomy scar which she states is from when she had an overdose 5 years ago.  There is no evidence of acute airway issue.  Although the patient lives in a group home, we confirmed that she does not have a guardian and independently makes her own medical decisions.  X-ray revealed subluxation of the right shoulder without frank  dislocation.  I reviewed the past medical records in epic.  Patient was previously evaluated by orthopedics in September 2018 for chronic shoulder pain, and an initial x-ray at that time showed a similar subluxation but when she had a subsequent CT the shoulder was in place.  I consulted orthopedics, and the consultant recommended normal procedure for reduction.  Based on the patient's severe pain with any movement of the shoulder, she will require moderate sedation.  Anticipate discharge home if the shoulder is properly reduced.  ----------------------------------------- 7:44 PM on 01/22/2018 -----------------------------------------  Reduction performed successfully with patient able to touch her opposite shoulder, and she was immediately placed in a sling, however while emerging from anesthesia, the patient repeatedly attempted to move the arm despite multiple attempts to restrain her, and was not compliant with being still after the procedure.  By the time that the x-ray was obtained the shoulder once again  appeared subluxed.    I consulted Dr. Allena Katz from orthopedics who had previously evaluated the patient, he recommended a second attempt in the ED given that the patient is still symptomatic, and that we should contact him again for further recommendations if it is not successful.  ----------------------------------------- 9:12 PM on 01/22/2018 -----------------------------------------  Shoulder reduction attempted a second time, and this time was successful.  Postreduction film confirms successfully reduced shoulder.  Based on recommendations per Dr. Allena Katz, patient can be safely discharged.  She is neuro/vascular intact after the procedure.  She will be placed in a shoulder immobilizer, and I have given her follow-up information for Dr. Allena Katz.  I gave the patient thorough return precautions, and she expresses understanding.     ____________________________________________   FINAL  CLINICAL IMPRESSION(S) / ED DIAGNOSES  Final diagnoses:  Subluxation of right shoulder joint, initial encounter      NEW MEDICATIONS STARTED DURING THIS VISIT:  New Prescriptions   OXYCODONE-ACETAMINOPHEN (PERCOCET) 5-325 MG TABLET    Take 1 tablet by mouth every 6 (six) hours as needed for up to 3 days for severe pain.     Note:  This document was prepared using Dragon voice recognition software and may include unintentional dictation errors.  Dionne Bucy, MD 01/22/18 2113

## 2018-01-22 NOTE — ED Triage Notes (Signed)
Pt with shoulder pain possibly for 2 days. Pt with hx of shoulder dislocation and pt says that this feels the same.

## 2018-01-22 NOTE — ED Notes (Signed)
Report received from Bill RN 

## 2018-01-22 NOTE — ED Notes (Signed)
Patient left with staff from group home.

## 2018-01-22 NOTE — Clinical Social Work Note (Signed)
CSW spoke to [facility] in [place], [place], [phone] who said patient is from the group home, and is her own legal guardian.  CSW contacted family care home owner Jimmye NormanLawanda Ray [phone] who confirmed patient is her own legal guardian.  Family care home said that patient also has a mother who patient is able to contact when she needs to.  CSW updated bedside nurse, with information that was given to CSW.  Demaree KnackEric R. Tion Tse, MSW, Theresia MajorsLCSWA [phone]  01/22/2018 4:54 PM

## 2018-01-22 NOTE — ED Notes (Signed)
Patient declined discharge vital signs. 

## 2018-01-22 NOTE — Discharge Instructions (Signed)
Return to the emergency department for new, worsening, recurrent pain in the right shoulder, recurrent dislocation of the right shoulder, weakness or numbness in the right arm, or any other new or worsening symptoms that concern you.  You should make an appointment to follow-up with Dr. Allena KatzPatel again within the next week.  Keep the sling on at all times except when showering.

## 2018-01-22 NOTE — ED Notes (Signed)
Patient signed consent for treatment. Patient states she is independent and does not have a healthcare power of attorney.

## 2018-01-22 NOTE — ED Notes (Signed)
X-ray at bedside

## 2018-01-22 NOTE — ED Notes (Signed)
[facility] from [facility], [place]

## 2018-02-21 ENCOUNTER — Emergency Department: Payer: Medicare HMO

## 2018-02-21 ENCOUNTER — Encounter: Payer: Self-pay | Admitting: *Deleted

## 2018-02-21 ENCOUNTER — Emergency Department
Admission: EM | Admit: 2018-02-21 | Discharge: 2018-02-21 | Disposition: A | Discharge status: Home / Self Care | Payer: Medicare HMO | Attending: Emergency Medicine | Admitting: Emergency Medicine

## 2018-02-21 DIAGNOSIS — R569 Unspecified convulsions: Secondary | ICD-10-CM | POA: Insufficient documentation

## 2018-02-21 DIAGNOSIS — E1140 Type 2 diabetes mellitus with diabetic neuropathy, unspecified: Secondary | ICD-10-CM | POA: Insufficient documentation

## 2018-02-21 DIAGNOSIS — Z79899 Other long term (current) drug therapy: Secondary | ICD-10-CM | POA: Insufficient documentation

## 2018-02-21 DIAGNOSIS — F17200 Nicotine dependence, unspecified, uncomplicated: Secondary | ICD-10-CM | POA: Insufficient documentation

## 2018-02-21 DIAGNOSIS — J449 Chronic obstructive pulmonary disease, unspecified: Secondary | ICD-10-CM | POA: Diagnosis not present

## 2018-02-21 HISTORY — DX: Unspecified convulsions: R56.9

## 2018-02-21 LAB — COMPREHENSIVE METABOLIC PANEL
ALT: 65 U/L — ABNORMAL HIGH (ref 14–54)
ANION GAP: 7 (ref 5–15)
AST: 91 U/L — ABNORMAL HIGH (ref 15–41)
Albumin: 3.3 g/dL — ABNORMAL LOW (ref 3.5–5.0)
Alkaline Phosphatase: 67 U/L (ref 38–126)
BILIRUBIN TOTAL: 0.7 mg/dL (ref 0.3–1.2)
BUN: 11 mg/dL (ref 6–20)
CO2: 25 mmol/L (ref 22–32)
Calcium: 9.4 mg/dL (ref 8.9–10.3)
Chloride: 105 mmol/L (ref 101–111)
Creatinine, Ser: 0.79 mg/dL (ref 0.44–1.00)
Glucose, Bld: 123 mg/dL — ABNORMAL HIGH (ref 65–99)
POTASSIUM: 4.6 mmol/L (ref 3.5–5.1)
Sodium: 137 mmol/L (ref 135–145)
Total Protein: 7.1 g/dL (ref 6.5–8.1)

## 2018-02-21 LAB — CBC
HEMATOCRIT: 37.3 % (ref 35.0–47.0)
Hemoglobin: 12.6 g/dL (ref 12.0–16.0)
MCH: 35.2 pg — ABNORMAL HIGH (ref 26.0–34.0)
MCHC: 33.7 g/dL (ref 32.0–36.0)
MCV: 104.5 fL — AB (ref 80.0–100.0)
Platelets: 327 10*3/uL (ref 150–440)
RBC: 3.57 MIL/uL — ABNORMAL LOW (ref 3.80–5.20)
RDW: 15.6 % — AB (ref 11.5–14.5)
WBC: 6.5 10*3/uL (ref 3.6–11.0)

## 2018-02-21 LAB — VALPROIC ACID LEVEL: VALPROIC ACID LVL: 79 ug/mL (ref 50.0–100.0)

## 2018-02-21 MED ORDER — IBUPROFEN 400 MG PO TABS
400.0000 mg | ORAL_TABLET | Freq: Once | ORAL | Status: AC
  Administered 2018-02-21: 400 mg via ORAL

## 2018-02-21 MED ORDER — IBUPROFEN 400 MG PO TABS
ORAL_TABLET | ORAL | Status: AC
  Administered 2018-02-21: 400 mg via ORAL
  Filled 2018-02-21: qty 1

## 2018-02-21 MED ORDER — ACETAMINOPHEN 500 MG PO TABS: 1000.0000 mg | ORAL_TABLET | Freq: Once | ORAL | Status: DC

## 2018-02-21 MED ORDER — ACETAMINOPHEN 500 MG PO TABS
ORAL_TABLET | ORAL | Status: AC
  Filled 2018-02-21: qty 2

## 2018-02-21 NOTE — ED Triage Notes (Signed)
[facility]. Per EMS report, the staff at the home witnessed a seizure lasting approx 30 sec. On arrival by EMS to the scene, the pt was alert and oriented, no signs of being post ictal. Pt reports hx of seizures, has a neurologist, but states not taking an medications for seizures, last seizure was 3 weeks ago.

## 2018-02-21 NOTE — ED Provider Notes (Signed)
[facility] Emergency Department Provider Note   ____________________________________________    I have reviewed the triage vital signs and the nursing notes.   HISTORY  Chief Complaint Seizures     HPI Shelly Anderson is a 47 y.o. female with a reported history of seizures, diabetes, COPD who presents after seizure apparently.  The history is somewhat vague, EMS reports the patient was joking and laughing upon their arrival, no postictal phase, no urinary incontinence.  The patient feels well she states that stress and anxiety can cause seizures.  There was a witness who said something like "she was moving funny ".  Review of records illustrates that the patient has followed up with Dr. Malvin Johns has had multiple normal EEGs, regardless she is on Depakote.   Past Medical History:  Diagnosis Date  . Anemia   . COPD (chronic obstructive pulmonary disease) (HCC)   . Diabetes mellitus without complication (HCC)   . GERD (gastroesophageal reflux disease)   . Neuropathy   . Seizures (HCC)     There are no active problems to display for this patient.   Past Surgical History:  Procedure Laterality Date  . KNEE SURGERY      Prior to Admission medications   Medication Sig Start Date End Date Taking? Authorizing Provider  acetaminophen (TYLENOL) 650 MG CR tablet Take 650 mg by mouth every 8 (eight) hours as needed for pain.    [provider]  atorvastatin (LIPITOR) 10 MG tablet Take 10 mg by mouth daily at 6 PM.    [provider]  calcium carbonate (TUMS - DOSED IN MG ELEMENTAL CALCIUM) 500 MG chewable tablet Chew 1 tablet by mouth daily.    [provider]  clotrimazole (LOTRIMIN) 1 % cream Apply 1 application topically 2 (two) times daily.    [provider]  cyclobenzaprine (FLEXERIL) 10 MG tablet Take 10 mg by mouth at bedtime as needed for muscle spasms.    [provider]  divalproex (DEPAKOTE) 250 MG  DR tablet Take 250 mg by mouth 2 (two) times daily.    [provider]  divalproex (DEPAKOTE) 500 MG DR tablet Take 500-1,000 mg by mouth 2 (two) times daily.    [provider]  DULoxetine (CYMBALTA) 60 MG capsule Take 60 mg by mouth daily.    [provider]  fenofibrate (TRICOR) 145 MG tablet Take 145 mg by mouth daily.    [provider]  fluticasone (FLONASE) 50 MCG/ACT nasal spray Place 1 spray into both nostrils daily.    [provider]  hydrOXYzine (ATARAX/VISTARIL) 10 MG tablet Take 10 mg by mouth 3 (three) times daily as needed for itching.     [provider]  hydrOXYzine (VISTARIL) 25 MG capsule Take 25 mg by mouth at bedtime.    [provider]  ketotifen (ZADITOR) 0.025 % ophthalmic solution Place 1 drop into both eyes 2 (two) times daily as needed.    [provider]  LORazepam (ATIVAN) 0.5 MG tablet Take 0.5 mg by mouth 2 (two) times daily.    [provider]  LORazepam (ATIVAN) 0.5 MG tablet Take 0.5 mg by mouth daily as needed for anxiety.    [provider]  Melatonin 3 MG TABS Take 3 mg by mouth daily.    [provider]  meloxicam (MOBIC) 15 MG tablet Take 15 mg by mouth daily.    [provider]  mirtazapine (REMERON) 7.5 MG tablet Take 7.5  mg by mouth at bedtime.    [provider]  naproxen (NAPROSYN) 500 MG tablet Take 1 tablet (500 mg total) by mouth 2 (two) times daily with a meal. Patient not taking: Reported on 01/22/2018 11/13/16   Hagler, Jami L, PA-C  polyethylene glycol (MIRALAX / GLYCOLAX) packet Take 17 g by mouth daily as needed for mild constipation.     [provider]  QUEtiapine (SEROQUEL XR) 300 MG 24 hr tablet Take 300 mg by mouth 2 (two) times daily.    [provider]     Allergies Patient has no known allergies.  No family history on file.  Social History Social History   Tobacco Use  . Smoking status: Current  Every Day Smoker  . Smokeless tobacco: Never Used  Substance Use Topics  . Alcohol use: No  . Drug use: Not on file    Review of Systems  Constitutional: No fever/chills Eyes: No visual changes.  ENT: No neck pain Cardiovascular: Denies chest pain. Respiratory: Denies shortness of breath.  Positive cough gastrointestinal: No abdominal pain.     Genitourinary: Negative for dysuria. Musculoskeletal: Chronic right shoulder pain Skin: Small bruise to the right thigh Neurological: Negative for headaches    ____________________________________________   PHYSICAL EXAM:  VITAL SIGNS: ED Triage Vitals  Enc Vitals Group     BP 02/21/18 1535 (!) 152/83     Pulse Rate 02/21/18 1535 (!) 108     Resp 02/21/18 1535 17     Temp 02/21/18 1535 98.2 F (36.8 C)     Temp Source 02/21/18 1535 Oral     SpO2 02/21/18 1535 97 %     Weight 02/21/18 1533 90.7 kg (200 lb)     Height 02/21/18 1533 1.575 m (5\' 2" )     Head Circumference --      Peak Flow --      Pain Score 02/21/18 1533 6     Pain Loc --      Pain Edu? --      Excl. in GC? --     Constitutional: Alert and oriented. No acute distress.  Eyes: Conjunctivae are normal.  Head: Atraumatic. Nose: No epistaxis or swelling Mouth/Throat: Mucous membranes are moist.    Cardiovascular: Normal rate, regular rhythm. Grossly normal heart sounds.  Good peripheral circulation. Respiratory: Normal respiratory effort.  No retractions. Lungs CTAB. Gastrointestinal: Soft and nontender. No distention.  No CVA tenderness. Genitourinary: deferred Musculoskeletal: Warm and well perfused Neurologic:  Normal speech and language. No gross focal neurologic deficits are appreciated.  Normal strength in all extremities Skin:  Skin is warm, dry and intact. No rash noted. Psychiatric: Mood and affect are normal. Speech and behavior are normal.  ____________________________________________   LABS (all labs ordered are listed, but only abnormal  results are displayed)  Labs Reviewed  CBC - Abnormal; Notable for the following components:      Result Value   RBC 3.57 (*)    MCV 104.5 (*)    MCH 35.2 (*)    RDW 15.6 (*)    All other components within normal limits  COMPREHENSIVE METABOLIC PANEL - Abnormal; Notable for the following components:   Glucose, Bld 123 (*)    Albumin 3.3 (*)    AST 91 (*)    ALT 65 (*)    All other components within normal limits  VALPROIC ACID LEVEL   ____________________________________________  EKG  ED ECG REPORT I, Jene Every, the attending physician, personally viewed and  interpreted this ECG.  Date: 02/21/2018  Rhythm: normal sinus rhythm QRS Axis: normal Intervals: normal ST/T Wave abnormalities: Nonspecific lateral T wave ab normalities   ____________________________________________  RADIOLOGY  None ____________________________________________   PROCEDURES  Procedure(s) performed: No  Procedures   Critical Care performed: No ____________________________________________   INITIAL IMPRESSION / ASSESSMENT AND PLAN / ED COURSE  Pertinent labs & imaging results that were available during my care of the patient were reviewed by me and considered in my medical decision making (see chart for details).  Patient well-appearing in no acute distress.  No postictal phase apparently, no urinary incontinence, no tongue laceration, history of chronic shoulder pain and has dislocated in the past but able to range it fully.  Unclear to me whether she did have a seizure or whether her seizures are possibly stress related.  We will check Depakote levels, insert IV, monitor carefully  ----------------------------------------- 5:55 PM on 02/21/2018 -----------------------------------------  Patient is requesting to leave.  Lab work is unremarkable, Depakote level is therapeutic, chest x-ray unremarkable.  No seizure activity while in the emergency department.  She agrees to  follow-up with her neurologist, return precautions discussed    ____________________________________________   FINAL CLINICAL IMPRESSION(S) / ED DIAGNOSES  Final diagnoses:  Seizure-like activity (HCC)        Note:  This document was prepared using Dragon voice recognition software and may include unintentional dictation errors.    Jene EveryKinner, Mariyam Remington, MD 02/21/18 1755

## 2018-02-21 NOTE — ED Notes (Signed)
Shelly Anderson called back and they are waiting on cab driver to return from another run, pt notified of delay.

## 2018-02-21 NOTE — ED Notes (Signed)
Pt informed MD ordered tylenol for headache. Pt states that tylenol does not work for her and request something else for headache.

## 2018-02-21 NOTE — ED Notes (Signed)
Pt given sprite and a turkey sandwich.  

## 2018-02-21 NOTE — ED Notes (Signed)
Pt ambulated to restroom without assistance.

## 2018-02-21 NOTE — ED Notes (Signed)
Shelly NormanLawanda Anderson returned call from [facility] home and request that we call [name] come for [facility] that they have contract with them. She will return to [address], [place], [place]

## 2018-02-21 NOTE — ED Notes (Signed)
[facility] called but no answer, message left to return call that pt needs to be picked up.  [phone]

## 2018-03-04 ENCOUNTER — Other Ambulatory Visit: Payer: Self-pay

## 2018-03-04 ENCOUNTER — Emergency Department (HOSPITAL_COMMUNITY)
Admission: EM | Admit: 2018-03-04 | Discharge: 2018-03-05 | Disposition: A | Discharge status: Home / Self Care | Payer: Medicare HMO | Attending: Emergency Medicine | Admitting: Emergency Medicine

## 2018-03-04 ENCOUNTER — Encounter (HOSPITAL_COMMUNITY): Payer: Self-pay

## 2018-03-04 DIAGNOSIS — R45851 Suicidal ideations: Secondary | ICD-10-CM | POA: Diagnosis not present

## 2018-03-04 DIAGNOSIS — F17210 Nicotine dependence, cigarettes, uncomplicated: Secondary | ICD-10-CM | POA: Diagnosis not present

## 2018-03-04 DIAGNOSIS — F32A Depression, unspecified: Secondary | ICD-10-CM

## 2018-03-04 DIAGNOSIS — E1140 Type 2 diabetes mellitus with diabetic neuropathy, unspecified: Secondary | ICD-10-CM | POA: Insufficient documentation

## 2018-03-04 DIAGNOSIS — J449 Chronic obstructive pulmonary disease, unspecified: Secondary | ICD-10-CM | POA: Insufficient documentation

## 2018-03-04 DIAGNOSIS — F329 Major depressive disorder, single episode, unspecified: Secondary | ICD-10-CM | POA: Diagnosis not present

## 2018-03-04 DIAGNOSIS — Z79899 Other long term (current) drug therapy: Secondary | ICD-10-CM | POA: Diagnosis not present

## 2018-03-04 LAB — COMPREHENSIVE METABOLIC PANEL
ALT: 42 U/L (ref 14–54)
ANION GAP: 9 (ref 5–15)
AST: 56 U/L — ABNORMAL HIGH (ref 15–41)
Albumin: 3 g/dL — ABNORMAL LOW (ref 3.5–5.0)
Alkaline Phosphatase: 73 U/L (ref 38–126)
BILIRUBIN TOTAL: 0.5 mg/dL (ref 0.3–1.2)
BUN: 22 mg/dL — AB (ref 6–20)
CO2: 23 mmol/L (ref 22–32)
Calcium: 8.9 mg/dL (ref 8.9–10.3)
Chloride: 105 mmol/L (ref 101–111)
Creatinine, Ser: 1.21 mg/dL — ABNORMAL HIGH (ref 0.44–1.00)
GFR calc non Af Amer: 54 mL/min — ABNORMAL LOW (ref >60)
Glucose, Bld: 109 mg/dL — ABNORMAL HIGH (ref 65–99)
POTASSIUM: 3.9 mmol/L (ref 3.5–5.1)
Sodium: 137 mmol/L (ref 135–145)
TOTAL PROTEIN: 6.6 g/dL (ref 6.5–8.1)

## 2018-03-04 LAB — RAPID URINE DRUG SCREEN, HOSP PERFORMED
Amphetamines: NOT DETECTED
BENZODIAZEPINES: POSITIVE — AB
Barbiturates: NOT DETECTED
Cocaine: NOT DETECTED
Opiates: NOT DETECTED
Tetrahydrocannabinol: POSITIVE — AB

## 2018-03-04 LAB — CBC WITH DIFFERENTIAL/PLATELET
Basophils Absolute: 0 10*3/uL (ref 0.0–0.1)
Basophils Relative: 0 %
EOS PCT: 1 %
Eosinophils Absolute: 0.1 10*3/uL (ref 0.0–0.7)
HCT: 35.1 % — ABNORMAL LOW (ref 36.0–46.0)
HEMOGLOBIN: 11.2 g/dL — AB (ref 12.0–15.0)
Lymphocytes Relative: 37 %
Lymphs Abs: 3.5 10*3/uL (ref 0.7–4.0)
MCH: 33.9 pg (ref 26.0–34.0)
MCHC: 31.9 g/dL (ref 30.0–36.0)
MCV: 106.4 fL — AB (ref 78.0–100.0)
Monocytes Absolute: 0.9 10*3/uL (ref 0.1–1.0)
Monocytes Relative: 10 %
Neutro Abs: 4.9 10*3/uL (ref 1.7–7.7)
Neutrophils Relative %: 52 %
PLATELETS: 367 10*3/uL (ref 150–400)
RBC: 3.3 MIL/uL — AB (ref 3.87–5.11)
RDW: 15.5 % (ref 11.5–15.5)
WBC: 9.4 10*3/uL (ref 4.0–10.5)

## 2018-03-04 LAB — I-STAT BETA HCG BLOOD, ED (MC, WL, AP ONLY): HCG, QUANTITATIVE: 5.5 m[IU]/mL — AB (ref <5)

## 2018-03-04 LAB — ETHANOL: Alcohol, Ethyl (B): <10 mg/dL (ref <10)

## 2018-03-04 NOTE — ED Triage Notes (Signed)
[facility] is [facility] [place], states she has been there for about a month and she doesn't like to stay there and is now suicidal.  Pt states she is "tired of being there because they are mean to me"  Pt admits to multiple self harm attempts in the past with cutting herself.   Pt is tearful
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
## 2018-03-05 ENCOUNTER — Encounter (HOSPITAL_COMMUNITY): Payer: Self-pay | Admitting: Registered Nurse

## 2018-03-05 DIAGNOSIS — F329 Major depressive disorder, single episode, unspecified: Secondary | ICD-10-CM | POA: Insufficient documentation

## 2018-03-05 DIAGNOSIS — R45851 Suicidal ideations: Secondary | ICD-10-CM | POA: Diagnosis not present

## 2018-03-05 DIAGNOSIS — F32A Depression, unspecified: Secondary | ICD-10-CM | POA: Insufficient documentation

## 2018-03-05 MED ORDER — LORAZEPAM 0.5 MG PO TABS
0.5000 mg | ORAL_TABLET | Freq: Two times a day (BID) | ORAL | Status: DC
  Administered 2018-03-05 (×2): 0.5 mg via ORAL
  Filled 2018-03-05 (×2): qty 1

## 2018-03-05 MED ORDER — IBUPROFEN 800 MG PO TABS
800.0000 mg | ORAL_TABLET | Freq: Once | ORAL | Status: AC
  Administered 2018-03-05: 800 mg via ORAL
  Filled 2018-03-05 (×2): qty 1

## 2018-03-05 MED ORDER — QUETIAPINE FUMARATE ER 300 MG PO TB24
300.0000 mg | ORAL_TABLET | Freq: Two times a day (BID) | ORAL | Status: DC
  Administered 2018-03-05: 300 mg via ORAL
  Filled 2018-03-05 (×8): qty 1

## 2018-03-05 MED ORDER — ATORVASTATIN CALCIUM 10 MG PO TABS
10.0000 mg | ORAL_TABLET | Freq: Every day | ORAL | Status: DC
  Filled 2018-03-05 (×3): qty 1

## 2018-03-05 MED ORDER — HYDROXYZINE HCL 10 MG PO TABS
10.0000 mg | ORAL_TABLET | Freq: Three times a day (TID) | ORAL | Status: DC | PRN
  Filled 2018-03-05: qty 1

## 2018-03-05 MED ORDER — MIRTAZAPINE 15 MG PO TABS
7.5000 mg | ORAL_TABLET | Freq: Every day | ORAL | Status: DC
  Filled 2018-03-05 (×3): qty 1

## 2018-03-05 MED ORDER — HYDROXYZINE HCL 25 MG PO TABS: 25.0000 mg | ORAL_TABLET | Freq: Every day | ORAL | Status: DC

## 2018-03-05 MED ORDER — DULOXETINE HCL 30 MG PO CPEP
60.0000 mg | ORAL_CAPSULE | Freq: Every day | ORAL | Status: DC
  Administered 2018-03-05 (×2): 60 mg via ORAL
  Filled 2018-03-05 (×2): qty 2

## 2018-03-05 MED ORDER — DIVALPROEX SODIUM 250 MG PO DR TAB
250.0000 mg | DELAYED_RELEASE_TABLET | Freq: Two times a day (BID) | ORAL | Status: DC
  Administered 2018-03-05: 250 mg via ORAL
  Filled 2018-03-05: qty 1

## 2018-03-05 MED ORDER — DIVALPROEX SODIUM 250 MG PO DR TAB
500.0000 mg | DELAYED_RELEASE_TABLET | Freq: Two times a day (BID) | ORAL | Status: DC
  Administered 2018-03-05: 500 mg via ORAL
  Administered 2018-03-05: 1000 mg via ORAL
  Filled 2018-03-05: qty 2
  Filled 2018-03-05: qty 4

## 2018-03-05 NOTE — [facility] Counselor (Signed)
Disposition:   Per Assunta FoundShuvon Rankin, NP, patient is psych-cleared. Neysa Bonitohristy, RN, was notified of patient disposition.

## 2018-03-05 NOTE — ED Notes (Signed)
Spoke with Michail Sermonavid Boswell concerning d/c and need follow up with FNP, has an appt on Wednesday already scheduled

## 2018-03-05 NOTE — ED Notes (Signed)
Pt refused Ibuprofen tablet at this time. [facility].

## 2018-03-05 NOTE — Clinical Social Work Note (Signed)
LCSW left a message for facility administrator, Jimmye NormanLawanda Ray, to discuss outpatient service provider. Message left at 11:59 a.m.    Dinesha Twiggs, Juleen ChinaHeather D, LCSW
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
## 2018-03-05 NOTE — Clinical Social Work Note (Signed)
LCSW spoke with Jimmye NormanLawanda Ray, facility administrator. She advised that patient [place] that comes to the facility to work with her.  LCSW advised that patient had been psych cleared and was ready for pick up.   LCSW signing off.

## 2018-03-05 NOTE — Discharge Instructions (Signed)
Follow up as directed by social work and behavioral health.

## 2018-03-05 NOTE — ED Notes (Signed)
Meal given to patient.  Pt calm and cooperative at this time.

## 2018-04-08 ENCOUNTER — Ambulatory Visit: Payer: Medicare HMO | Admitting: Podiatry

## 2018-04-19 ENCOUNTER — Ambulatory Visit: Payer: Medicare HMO | Admitting: Podiatry

## 2018-05-14 ENCOUNTER — Other Ambulatory Visit: Payer: Self-pay | Admitting: Family Medicine

## 2018-05-14 ENCOUNTER — Ambulatory Visit
Admission: RE | Admit: 2018-05-14 | Discharge: 2018-05-14 | Disposition: A | Discharge status: Home / Self Care | Payer: Medicare HMO | Source: Ambulatory Visit | Attending: Family Medicine | Admitting: Family Medicine

## 2018-05-14 DIAGNOSIS — M25561 Pain in right knee: Secondary | ICD-10-CM | POA: Insufficient documentation

## 2018-05-14 DIAGNOSIS — M25562 Pain in left knee: Principal | ICD-10-CM

## 2018-05-14 DIAGNOSIS — M238X2 Other internal derangements of left knee: Secondary | ICD-10-CM | POA: Diagnosis not present

## 2018-05-14 DIAGNOSIS — M25461 Effusion, right knee: Secondary | ICD-10-CM | POA: Insufficient documentation

## 2018-05-14 DIAGNOSIS — M1711 Unilateral primary osteoarthritis, right knee: Secondary | ICD-10-CM | POA: Diagnosis not present

## 2018-06-15 ENCOUNTER — Emergency Department: Payer: Medicare HMO

## 2018-06-15 ENCOUNTER — Emergency Department
Admission: EM | Admit: 2018-06-15 | Discharge: 2018-06-15 | Disposition: A | Discharge status: Home / Self Care | Payer: Medicare HMO | Attending: Emergency Medicine | Admitting: Emergency Medicine

## 2018-06-15 ENCOUNTER — Encounter: Payer: Self-pay | Admitting: Emergency Medicine

## 2018-06-15 ENCOUNTER — Other Ambulatory Visit: Payer: Self-pay

## 2018-06-15 DIAGNOSIS — F329 Major depressive disorder, single episode, unspecified: Secondary | ICD-10-CM | POA: Insufficient documentation

## 2018-06-15 DIAGNOSIS — F17200 Nicotine dependence, unspecified, uncomplicated: Secondary | ICD-10-CM | POA: Insufficient documentation

## 2018-06-15 DIAGNOSIS — Y929 Unspecified place or not applicable: Secondary | ICD-10-CM | POA: Insufficient documentation

## 2018-06-15 DIAGNOSIS — W19XXXA Unspecified fall, initial encounter: Secondary | ICD-10-CM

## 2018-06-15 DIAGNOSIS — S4991XA Unspecified injury of right shoulder and upper arm, initial encounter: Secondary | ICD-10-CM | POA: Diagnosis present

## 2018-06-15 DIAGNOSIS — E119 Type 2 diabetes mellitus without complications: Secondary | ICD-10-CM | POA: Insufficient documentation

## 2018-06-15 DIAGNOSIS — Y9389 Activity, other specified: Secondary | ICD-10-CM | POA: Diagnosis not present

## 2018-06-15 DIAGNOSIS — M24419 Recurrent dislocation, unspecified shoulder: Secondary | ICD-10-CM

## 2018-06-15 DIAGNOSIS — J449 Chronic obstructive pulmonary disease, unspecified: Secondary | ICD-10-CM | POA: Insufficient documentation

## 2018-06-15 DIAGNOSIS — Z79899 Other long term (current) drug therapy: Secondary | ICD-10-CM | POA: Diagnosis not present

## 2018-06-15 DIAGNOSIS — W01198A Fall on same level from slipping, tripping and stumbling with subsequent striking against other object, initial encounter: Secondary | ICD-10-CM | POA: Insufficient documentation

## 2018-06-15 DIAGNOSIS — Y998 Other external cause status: Secondary | ICD-10-CM | POA: Diagnosis not present

## 2018-06-15 DIAGNOSIS — M24411 Recurrent dislocation, right shoulder: Secondary | ICD-10-CM | POA: Diagnosis not present

## 2018-06-15 MED ORDER — OXYCODONE HCL 5 MG PO TABS
5.0000 mg | ORAL_TABLET | Freq: Once | ORAL | Status: AC
  Administered 2018-06-15: 5 mg via ORAL
  Filled 2018-06-15: qty 1

## 2018-06-15 MED ORDER — SILVER SULFADIAZINE 1 % EX CREA
TOPICAL_CREAM | Freq: Two times a day (BID) | CUTANEOUS | Status: DC
  Administered 2018-06-15: 17:00:00 via TOPICAL

## 2018-06-15 MED ORDER — FLUMAZENIL 0.5 MG/5ML IV SOLN
INTRAVENOUS | Status: AC
  Filled 2018-06-15: qty 5

## 2018-06-15 MED ORDER — MIDAZOLAM HCL 5 MG/5ML IJ SOLN
INTRAMUSCULAR | Status: AC | PRN
  Administered 2018-06-15 (×4): 2 mg via INTRAVENOUS

## 2018-06-15 MED ORDER — FENTANYL CITRATE (PF) 100 MCG/2ML IJ SOLN
INTRAMUSCULAR | Status: AC
  Filled 2018-06-15: qty 4

## 2018-06-15 MED ORDER — NALOXONE HCL 2 MG/2ML IJ SOSY
PREFILLED_SYRINGE | INTRAMUSCULAR | Status: AC
  Filled 2018-06-15: qty 2

## 2018-06-15 MED ORDER — SILVER SULFADIAZINE 1 % EX CREA
TOPICAL_CREAM | CUTANEOUS | Status: AC
  Filled 2018-06-15: qty 85

## 2018-06-15 MED ORDER — MIDAZOLAM HCL 5 MG/5ML IJ SOLN
INTRAMUSCULAR | Status: AC
  Filled 2018-06-15: qty 10

## 2018-06-15 MED ORDER — OXYCODONE-ACETAMINOPHEN 5-325 MG PO TABS: 1.0000 | ORAL_TABLET | Freq: Four times a day (QID) | ORAL | 0 refills | Status: DC | PRN

## 2018-06-15 MED ORDER — FENTANYL CITRATE (PF) 100 MCG/2ML IJ SOLN
INTRAMUSCULAR | Status: AC | PRN
  Administered 2018-06-15 (×2): 50 ug via INTRAVENOUS
  Administered 2018-06-15: 25 ug via INTRAVENOUS

## 2018-06-15 NOTE — Discharge Instructions (Signed)
Please wear the shoulder immobilizer.  Please follow-up with Dr. [facility].  Give his office a call in the morning to arrange follow-up within a week.  Use Tylenol or Advil as needed for pain.  Per severe pain you can use the Percocet I gave you 1 up to 4 times a day.  Return for any new symptoms or if your shoulder goes out of joint again.

## 2018-06-15 NOTE — ED Notes (Signed)
Patient waiting in room for ride.

## 2018-06-15 NOTE — ED Notes (Signed)
Patient's ride on their way to pick up patient.
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
## 2018-06-15 NOTE — ED Triage Notes (Signed)
Pt reports she stood up to get her cane and the chair broke and she fell hurting her right shoulder. No swelling or deformity seen.

## 2018-06-15 NOTE — ED Notes (Signed)
Patient was eating and drinking

## 2018-06-15 NOTE — ED Provider Notes (Addendum)
[facility] Emergency Department Provider Note   ____________________________________________   First MD Initiated Contact with Patient 06/15/18 1811     (approximate)  I have reviewed the triage vital signs and the nursing notes.   HISTORY  Chief Complaint Shoulder Injury and Fall    HPI Shelly Anderson is a 47 y.o. female who was leaning on a chair that broke she fell and hit her shoulder.  This happened on Thursday.  She continues to have pain.  X-ray done in fast track showed a shoulder dislocation.  She was transferred to the main side for shoulder relocation.  I obtained oral consent from the patient and then written consent.  Patient was sedated with Versed and fentanyl and the shoulder was manipulated with reduction of the shoulder.  Patient was neurovascularly intact before and after.   Past Medical History:  Diagnosis Date  . Anemia   . COPD (chronic obstructive pulmonary disease) (HCC)   . Diabetes mellitus without complication (HCC)   . GERD (gastroesophageal reflux disease)   . Neuropathy   . Seizures [facility])     Patient Active Problem List   Diagnosis Date Noted  . Suicidal ideation 03/05/2018  . Depression     Past Surgical History:  Procedure Laterality Date  . KNEE SURGERY      Prior to Admission medications   Medication Sig Start Date End Date Taking? Authorizing Provider  acetaminophen (TYLENOL) 500 MG tablet Take 1,000 mg by mouth every 8 (eight) hours as needed for mild pain.    [provider]  albuterol (PROVENTIL HFA;VENTOLIN HFA) 108 (90 Base) MCG/ACT inhaler Inhale 1-2 puffs into the lungs every 6 (six) hours as needed for wheezing or shortness of breath.    [provider]  atorvastatin (LIPITOR) 40 MG tablet Take 40 mg by mouth daily at 6 PM.     [provider]  calcium carbonate (TUMS - DOSED IN MG ELEMENTAL CALCIUM) 500 MG chewable tablet Chew 1 tablet by mouth 2 (two) times daily.      [provider]  clotrimazole (LOTRIMIN) 1 % cream Apply 1 application topically 2 (two) times daily.    [provider]  cyclobenzaprine (FLEXERIL) 10 MG tablet Take 10 mg by mouth at bedtime as needed for muscle spasms.    [provider]  dextromethorphan-guaiFENesin (ROBITUSSIN-DM) 10-100 MG/5ML liquid Take 5 mLs by mouth every 4 (four) hours as needed for cough.    [provider]  divalproex (DEPAKOTE) 250 MG DR tablet Take 250 mg by mouth 2 (two) times daily.    [provider]  divalproex (DEPAKOTE) 500 MG DR tablet Take 500-1,000 mg by mouth 2 (two) times daily.    [provider]  DULoxetine (CYMBALTA) 60 MG capsule Take 60 mg by mouth daily.    [provider]  fenofibrate (TRICOR) 145 MG tablet Take 145 mg by mouth daily.    [provider]  fluticasone (FLONASE) 50 MCG/ACT nasal spray Place 1 spray into both nostrils daily.    [provider]  hydrOXYzine (VISTARIL) 25 MG capsule Take 25 mg by mouth at bedtime.    [provider]  LORazepam (ATIVAN) 0.5 MG tablet Take 0.5 mg by mouth 2 (two) times daily.     [provider]  Melatonin 3 MG TABS Take 3 mg by mouth daily.    [provider]  meloxicam (MOBIC) 15 MG tablet Take 15 mg by mouth daily.  [provider]  mirtazapine (REMERON) 15 MG tablet Take 7.5 mg by mouth at bedtime.    [provider]  oxyCODONE-acetaminophen (PERCOCET) 5-325 MG tablet Take 1 tablet by mouth every 6 (six) hours as needed for severe pain. 06/15/18 06/15/19  Arnaldo Natal, MD  polyethylene glycol [facility] / Ethelene Hal) packet Take 17 g by mouth daily as needed for mild constipation.     [provider]  QUEtiapine (SEROQUEL XR) 300 MG 24 hr tablet Take 300 mg by mouth 2 (two) times daily.    [provider]  traMADol (ULTRAM) 50 MG tablet Take 50 mg by mouth daily as needed for moderate pain.    [provider]    Allergies Bee venom; Coconut oil; Eggs or egg-derived products; Penicillin g; and Phenytoin sodium extended  History reviewed. No pertinent family history.  Social History Social History   Tobacco Use  . Smoking status: Current Every Day Smoker  . Smokeless tobacco: Never Used  Substance Use Topics  . Alcohol use: No  . Drug use: Not on file    Review of Systems  Constitutional: No fever/chills Eyes: No visual changes. ENT: No sore throat. Cardiovascular: Denies chest pain. Respiratory: Denies shortness of breath. Gastrointestinal: No abdominal pain.  No nausea, no vomiting.  No diarrhea.  No constipation. Genitourinary: Negative for dysuria. Musculoskeletal: Negative for back pain. Skin: Negative for rash. Neurological: Negative for headaches, focal weakness ____________________________________________   PHYSICAL EXAM:  VITAL SIGNS: ED Triage Vitals  Enc Vitals Group     BP 06/15/18 1610 (!) 108/56     Pulse Rate 06/15/18 1610 94     Resp 06/15/18 1610 20     Temp 06/15/18 1610 98.1 F (36.7 C)     Temp Source 06/15/18 1610 Oral     SpO2 06/15/18 1610 96 %     Weight 06/15/18 1614 195 lb (88.5 kg)     Height 06/15/18 1614 5\' 2"  (1.575 m)     Head Circumference --      Peak Flow --      Pain Score 06/15/18 1613 10     Pain Loc --      Pain Edu? --      Excl. in GC? --     Constitutional: Alert and oriented. Well appearing and in no acute distress. Eyes: Conjunctivae are normal.  Head: Atraumatic. Nose: No congestion/rhinnorhea. Mouth/Throat: Mucous membranes are moist.  Oropharynx non-erythematous. Neck: No stridor.  Scar from tracheostomy present  cardiovascular: Normal rate, regular rhythm. Grossly normal heart sounds.  Good peripheral circulation. Respiratory: Normal respiratory effort.  No retractions. Lungs CTAB. Gastrointestinal: Soft and nontender. No distention. No abdominal bruits. No CVA tenderness. Musculoskeletal: No lower  extremity tenderness nor edema.  No joint effusions. Neurologic:  Normal speech and language. No gross focal neurologic deficits are appreciated. No gait instability. Skin:  Skin is warm, dry and intact. No rash noted. Psychiatric: Mood and affect are normal. Speech and behavior are normal.  ____________________________________________   LABS (all labs ordered are listed, but only abnormal results are displayed)  Labs Reviewed - No data to display ____________________________________________  EKG  ____________________________________________  RADIOLOGY  ED MD interpretation: Initial x-ray shows dislocation shoulder post reduction view appears to be reduced to me.  Official radiology report(s): Dg Shoulder Right  Result Date: 06/15/2018 CLINICAL DATA:  Pain status post fall 4 days ago. EXAM: RIGHT SHOULDER - 2+ VIEW COMPARISON:  01/22/2018 FINDINGS: Anterior subcoracoid humeral head dislocation is  identified. Rim of osteophytes noted about the humeral head. Soft tissue calcification consistent with rotator cuff tendinopathy is also noted. [facility]. The adjacent ribs and lung are nonacute. Surgical clip projects over the right axilla. IMPRESSION: 1. Anterior subcoracoid right humeral head dislocation. 2. Osteoarthritis of the right glenohumeral joint. 3. Calcific rotator cuff tendinopathy is noted. Electronically Signed   By: Tollie Ethavid  Kwon M.D.   On: 06/15/2018 17:59   Dg Shoulder Right Portable  Result Date: 06/15/2018 CLINICAL DATA:  Post reduction radiographs of the right shoulder. EXAM: PORTABLE RIGHT SHOULDER COMPARISON:  Pre reduction radiographs of the right shoulder from earlier on the same day. FINDINGS: Satisfactory reduction of the right humeral head relative to the glenoid. Small Hill-Sachs deformity is suggested. No bony Bankart lesion is noted. Osteoarthritic spurring and joint space narrowing is seen of the glenohumeral joint with subchondral cystic change of  the glenoid and rim of osteophytes noted about the humeral head-neck junction. Soft tissue calcification along the expected course of the rotator cuff is identified consistent with calcific rotator cuff tendinopathy. The adjacent right ribs and lung to the extent included are unremarkable. IMPRESSION: Satisfactory reduction of the right humeral head. No bony Bankart lesion is noted. There appears to be small Hill-Sachs deformity of the superolateral humeral head Calcific rotator cuff tendinopathy of the right shoulder. Electronically Signed   By: Tollie Ethavid  Kwon M.D.   On: 06/15/2018 19:24    ____________________________________________   PROCEDURES  Procedure(s) performed: See H&P for shoulder relocation note  .Sedation Date/Time: 07/12/2018 7:28 AM Performed by: Arnaldo NatalMalinda, Renardo Cheatum F, MD Authorized by: Arnaldo NatalMalinda, Yuritzi Kamp F, MD   Consent:    Consent obtained:  Verbal and written (electronic informed consent)   Consent given by:  Patient   Alternatives discussed:  Analgesia without sedation Universal protocol:    Procedure explained and questions answered to patient or proxy's satisfaction: yes     Relevant documents present and verified: yes     Test results available and properly labeled: yes     Imaging studies available: yes     Required blood products, implants, devices, and special equipment available: yes     Site/side marked: yes     Immediately prior to procedure a time out was called: yes     Patient identity confirmation method:  Arm band and verbally with patient Indications:    Procedure performed:  Dislocation reduction   Procedure necessitating sedation performed by:  Physician performing sedation   Intended level of sedation:  Moderate (conscious sedation) Pre-sedation assessment:    Time since last food or drink:  Over 6 hrs   ASA classification: class 2 - patient with mild systemic disease     Neck mobility: normal     Mouth opening:  3 or more finger widths   Thyromental  distance:  2 finger widths   Mallampati score:  II - soft palate, uvula, fauces visible   Pre-sedation assessments completed and reviewed: airway patency, cardiovascular function, hydration status, mental status, nausea/vomiting, pain level, respiratory function and temperature   Immediate pre-procedure details:    Verified: bag valve mask available, emergency equipment available, intubation equipment available, IV patency confirmed, oxygen available, reversal medications available and suction available   Procedure details (see MAR for exact dosages):    Preoxygenation:  Nasal cannula   Sedation:  Midazolam   Analgesia:  Fentanyl   Intra-procedure monitoring:  Blood pressure monitoring, continuous pulse oximetry, cardiac monitor, frequent vital sign checks, frequent LOC assessments  and continuous capnometry   Intra-procedure events: none     Total Provider sedation time (minutes):  30 Post-procedure details:    Post-sedation assessment completed:  06/15/2018 7:28 PM   Attendance: Constant attendance by certified staff until patient recovered     Recovery: Patient returned to pre-procedure baseline     Post-sedation assessments completed and reviewed: airway patency, cardiovascular function, hydration status, mental status and respiratory function     Patient is stable for discharge or admission: yes     Patient tolerance:  Tolerated well, no immediate complications Comments:     All times are approximate     Critical Care performed:   ____________________________________________   INITIAL IMPRESSION / ASSESSMENT AND PLAN / ED COURSE When patient wakes up we will discharge her with a shoulder immobilizer she can follow-up with Dr. Ernest Pine or her regular orthopedic surgeon.         ____________________________________________   FINAL CLINICAL IMPRESSION(S) / ED DIAGNOSES  Final diagnoses:  Fall, initial encounter  Shoulder dislocation, recurrent, unspecified laterality      ED Discharge Orders        Ordered    oxyCODONE-acetaminophen (PERCOCET) 5-325 MG tablet  Every 6 hours PRN     06/15/18 1924       Note:  This document was prepared using Dragon voice recognition software and may include unintentional dictation errors.    Arnaldo Natal, MD 06/15/18 1928  .procdoc    Arnaldo Natal, MD 07/12/18 [phone]

## 2018-06-15 NOTE — ED Notes (Signed)
See triage note  States she went to reach for her cane and fell  Landed on right side  Having pain to right shoulder  Limit movement d/t pain

## 2018-06-15 NOTE — ED Notes (Signed)
FIRST NURSE NOTE: R shoulder pain after fall Thursday. VSS with EMS.

## 2018-06-15 NOTE — ED Notes (Signed)
Patient was out walking in the hallway asking when she could leave.

## 2018-10-22 ENCOUNTER — Emergency Department
Admission: EM | Admit: 2018-10-22 | Discharge: 2018-10-23 | Disposition: A | Discharge status: Home / Self Care | Payer: Medicare HMO | Attending: Emergency Medicine | Admitting: Emergency Medicine

## 2018-10-22 DIAGNOSIS — Y9289 Other specified places as the place of occurrence of the external cause: Secondary | ICD-10-CM | POA: Insufficient documentation

## 2018-10-22 DIAGNOSIS — S60812A Abrasion of left wrist, initial encounter: Secondary | ICD-10-CM | POA: Insufficient documentation

## 2018-10-22 DIAGNOSIS — E119 Type 2 diabetes mellitus without complications: Secondary | ICD-10-CM | POA: Diagnosis not present

## 2018-10-22 DIAGNOSIS — R45851 Suicidal ideations: Secondary | ICD-10-CM | POA: Diagnosis not present

## 2018-10-22 DIAGNOSIS — Y999 Unspecified external cause status: Secondary | ICD-10-CM | POA: Insufficient documentation

## 2018-10-22 DIAGNOSIS — F339 Major depressive disorder, recurrent, unspecified: Secondary | ICD-10-CM

## 2018-10-22 DIAGNOSIS — Z79899 Other long term (current) drug therapy: Secondary | ICD-10-CM | POA: Diagnosis not present

## 2018-10-22 DIAGNOSIS — Y9389 Activity, other specified: Secondary | ICD-10-CM | POA: Diagnosis not present

## 2018-10-22 DIAGNOSIS — N390 Urinary tract infection, site not specified: Secondary | ICD-10-CM | POA: Diagnosis not present

## 2018-10-22 DIAGNOSIS — X788XXA Intentional self-harm by other sharp object, initial encounter: Secondary | ICD-10-CM | POA: Insufficient documentation

## 2018-10-22 DIAGNOSIS — R51 Headache: Secondary | ICD-10-CM | POA: Diagnosis not present

## 2018-10-22 DIAGNOSIS — F329 Major depressive disorder, single episode, unspecified: Secondary | ICD-10-CM | POA: Insufficient documentation

## 2018-10-22 DIAGNOSIS — Z008 Encounter for other general examination: Secondary | ICD-10-CM | POA: Diagnosis not present

## 2018-10-22 DIAGNOSIS — F17200 Nicotine dependence, unspecified, uncomplicated: Secondary | ICD-10-CM | POA: Insufficient documentation

## 2018-10-22 DIAGNOSIS — J449 Chronic obstructive pulmonary disease, unspecified: Secondary | ICD-10-CM | POA: Insufficient documentation

## 2018-10-22 DIAGNOSIS — T148XXA Other injury of unspecified body region, initial encounter: Secondary | ICD-10-CM

## 2018-10-22 NOTE — ED Provider Notes (Signed)
[facility] Emergency Department Provider Note  ____________________________________________   First MD Initiated Contact with Patient 10/22/18 2359     (approximate)  I have reviewed the triage vital signs and the nursing notes.   HISTORY  Chief Complaint Suicidal   HPI Shelly Anderson is a 47 y.o. female who comes to the emergency department via EMS after she cut her left wrist and a suicidal gesture shortly prior to arrival.  The patient lives at a group home and says that she has a long-standing history of depression for which she sees a psychiatrist.  Today she found out that her uncle died and it caused her an increasing amount of stress.  She took a knife that she found and cut her left wrist trying to hurt herself.  She says she is never attempted suicide in the past.  She is not sure if she wants to continue to live.  Her depression became acutely worse about an hour prior to arrival.  It was worsened by interpersonal stress of the family death.  Nothing seems to make it better.  She says she is allergic to tetanus shots.  Someone at her group home called 911 to have the patient evaluated.  She currently is reporting  a mild gradual onset not maximal onset bifrontal throbbing headache similar to previous headaches and is asking for ibuprofen.   Past Medical History:  Diagnosis Date  . Anemia   . COPD (chronic obstructive pulmonary disease) (HCC)   . Diabetes mellitus without complication (HCC)   . GERD (gastroesophageal reflux disease)   . Neuropathy   . Seizures [facility])     Patient Active Problem List   Diagnosis Date Noted  . Suicidal ideation 03/05/2018  . Depression     Past Surgical History:  Procedure Laterality Date  . KNEE SURGERY      Prior to Admission medications   Medication Sig Start Date End Date Taking? Authorizing Provider  acetaminophen (TYLENOL) 500 MG tablet Take 1,000 mg by mouth every 8 (eight) hours as needed for mild  pain.    [provider]  albuterol (PROVENTIL HFA;VENTOLIN HFA) 108 (90 Base) MCG/ACT inhaler Inhale 1-2 puffs into the lungs every 6 (six) hours as needed for wheezing or shortness of breath.    [provider]  atorvastatin (LIPITOR) 40 MG tablet Take 40 mg by mouth daily at 6 PM.     [provider]  calcium carbonate (TUMS - DOSED IN MG ELEMENTAL CALCIUM) 500 MG chewable tablet Chew 1 tablet by mouth 2 (two) times daily.     [provider]  clotrimazole (LOTRIMIN) 1 % cream Apply 1 application topically 2 (two) times daily.    [provider]  cyclobenzaprine (FLEXERIL) 10 MG tablet Take 10 mg by mouth at bedtime as needed for muscle spasms.    [provider]  dextromethorphan-guaiFENesin (ROBITUSSIN-DM) 10-100 MG/5ML liquid Take 5 mLs by mouth every 4 (four) hours as needed for cough.    [provider]  divalproex (DEPAKOTE) 250 MG DR tablet Take 250 mg by mouth 2 (two) times daily.    [provider]  divalproex (DEPAKOTE) 500 MG DR tablet Take 500-1,000 mg by mouth 2 (two) times daily.    [provider]  DULoxetine (CYMBALTA) 60 MG capsule Take 60 mg by mouth daily.    [provider]  fenofibrate (TRICOR) 145 MG tablet Take 145 mg by mouth daily.    [provider]  fluticasone (FLONASE) 50 MCG/ACT nasal spray Place 1 spray into both nostrils daily.    [provider]  hydrOXYzine (VISTARIL) 25 MG capsule Take 25 mg by mouth at bedtime.    [provider]  LORazepam (ATIVAN) 0.5 MG tablet Take 0.5 mg by mouth 2 (two) times daily.     [provider]  Melatonin 3 MG TABS Take 3 mg by mouth daily.    [provider]  meloxicam (MOBIC) 15 MG tablet Take 15 mg by mouth daily.    [provider]  mirtazapine (REMERON) 15 MG tablet Take 7.5 mg by mouth at bedtime.    [provider]  oxyCODONE-acetaminophen (PERCOCET) 5-325 MG tablet Take 1  tablet by mouth every 6 (six) hours as needed for severe pain. 06/15/18 06/15/19  Arnaldo Natal, MD  polyethylene glycol [facility] / Ethelene Hal) packet Take 17 g by mouth daily as needed for mild constipation.     [provider]  QUEtiapine (SEROQUEL XR) 300 MG 24 hr tablet Take 300 mg by mouth 2 (two) times daily.    [provider]  traMADol (ULTRAM) 50 MG tablet Take 50 mg by mouth daily as needed for moderate pain.    [provider]    Allergies Bee venom; Coconut oil; Eggs or egg-derived products; Penicillin g; and Phenytoin sodium extended  History reviewed. No pertinent family history.  Social History Social History   Tobacco Use  . Smoking status: Current Every Day Smoker  . Smokeless tobacco: Never Used  Substance Use Topics  . Alcohol use: No  . Drug use: Not on file    Review of Systems Constitutional: No fever/chills Eyes: No visual changes. ENT: No sore throat. Cardiovascular: Denies chest pain. Respiratory: Denies shortness of breath. Gastrointestinal: No abdominal pain.  No nausea, no vomiting.  No diarrhea.  No constipation. Genitourinary: Negative for dysuria. Musculoskeletal: Negative for back pain. Skin: Positive for wound Neurological: Positive for headache   ____________________________________________   PHYSICAL EXAM:  VITAL SIGNS: ED Triage Vitals  Enc Vitals Group     BP      Pulse      Resp      Temp      Temp src      SpO2      Weight      Height      Head Circumference      Peak Flow      Pain Score      Pain Loc      Pain Edu?      Excl. in GC?     Constitutional: Alert and oriented x4 sad affect nontoxic no diaphoresis Eyes: PERRL EOMI. midrange and brisk Head: Atraumatic. Nose: No congestion/rhinnorhea. Mouth/Throat: No trismus Neck: No stridor.   Cardiovascular: Normal rate, regular rhythm. Grossly normal heart sounds.  Good peripheral circulation. Respiratory: Normal respiratory effort.  No  retractions. Lungs CTAB and moving good air Gastrointestinal: Soft nontender Musculoskeletal: No tenderness over distal radius or distal ulna. No tenderness over snuffbox and no axial load discomfort Sensation intact to light touch over first dorsal webspace, distal index finger, distal small finger Can flex and oppose  thumb, cross 2 on 3, and extend wrist 2+ radial pulse and less than 2 second capillary refill Skin with slight abrasion as below. Compartments are soft  Neurologic:  Normal speech and language. No gross focal neurologic deficits are appreciated. Skin: Superficial abrasions to left volar wrist.  Please see photo below.  Nothing  that would require repair. Psychiatric: Mood and affect are normal. Speech and behavior are normal.      ____________________________________________   DIFFERENTIAL includes but not limited to  Laceration, tendon damage, suicide attempt, suicidal gesture, depression ____________________________________________   LABS (all labs ordered are listed, but only abnormal results are displayed)  Labs Reviewed  COMPREHENSIVE METABOLIC PANEL - Abnormal; Notable for the following components:      Result Value   Glucose, Bld 114 (*)    BUN 28 (*)    Albumin 3.2 (*)    All other components within normal limits  ACETAMINOPHEN LEVEL - Abnormal; Notable for the following components:   Acetaminophen (Tylenol), Serum <10 (*)    All other components within normal limits  CBC WITH DIFFERENTIAL/PLATELET - Abnormal; Notable for the following components:   RBC 3.26 (*)    Hemoglobin 11.1 (*)    HCT 33.9 (*)    MCV 104.0 (*)    All other components within normal limits  URINALYSIS, COMPLETE (UACMP) WITH MICROSCOPIC - Abnormal; Notable for the following components:   Color, Urine YELLOW (*)    APPearance CLOUDY (*)    Leukocytes, UA TRACE (*)    Bacteria, UA RARE (*)    All other components within normal limits  URINE DRUG SCREEN, QUALITATIVE ([facility] ONLY)  - Abnormal; Notable for the following components:   Tricyclic, Ur Screen POSITIVE (*)    MDMA (Ecstasy)Ur Screen POSITIVE (*)    Benzodiazepine, Ur Scrn POSITIVE (*)    All other components within normal limits  ETHANOL  SALICYLATE LEVEL    Lab work reviewed by me shows the patient is positive for MDMA, benzodiazepines, and try cyclic's.  Otherwise unremarkable __________________________________________  EKG   ____________________________________________  RADIOLOGY   ____________________________________________   PROCEDURES  Procedure(s) performed: no  Procedures  Critical Care performed: no  ____________________________________________   INITIAL IMPRESSION / ASSESSMENT AND PLAN / ED COURSE  Pertinent labs & imaging results that were available during my care of the patient were reviewed by me and considered in my medical decision making (see chart for details).   As part of my medical decision making, I reviewed the following data within the electronic MEDICAL RECORD NUMBER History obtained from family if available, nursing notes, old chart and ekg, as well as notes from prior ED visits.  The patient comes to the emergency department voluntarily with a very superficial laceration to her left wrist.  She is allergic to tetanus shots.  This wound does not require repair and she is neurovascularly intact.  At this time she is medically stable for psychiatric evaluation.    ----------------------------------------- 6:31 AM on 10/23/2018 -----------------------------------------  Specialist on-call finds no indication for inpatient psychiatric stabilization and recommends outpatient management.  Patient verbalizes understanding and agreement with the plan.  ____________________________________________   FINAL CLINICAL IMPRESSION(S) / ED DIAGNOSES  Final diagnoses:  Recurrent major depressive disorder, remission status unspecified (HCC)  Abrasion  Suicidal ideation       NEW MEDICATIONS STARTED DURING THIS VISIT:  New Prescriptions   No medications on file     Note:  This document was prepared using Dragon voice recognition software and may include unintentional dictation errors.     Merrily Brittle, MD 10/23/18 [phone]

## 2018-10-23 ENCOUNTER — Encounter: Payer: Self-pay | Admitting: Emergency Medicine

## 2018-10-23 DIAGNOSIS — F329 Major depressive disorder, single episode, unspecified: Secondary | ICD-10-CM | POA: Diagnosis not present

## 2018-10-23 LAB — CBC WITH DIFFERENTIAL/PLATELET
ABS IMMATURE GRANULOCYTES: 0.04 10*3/uL (ref 0.00–0.07)
Basophils Absolute: 0.1 10*3/uL (ref 0.0–0.1)
Basophils Relative: 1 %
Eosinophils Absolute: 0.2 10*3/uL (ref 0.0–0.5)
Eosinophils Relative: 2 %
HCT: 33.9 % — ABNORMAL LOW (ref 36.0–46.0)
HEMOGLOBIN: 11.1 g/dL — AB (ref 12.0–15.0)
Immature Granulocytes: 0 %
LYMPHS PCT: 40 %
Lymphs Abs: 3.7 10*3/uL (ref 0.7–4.0)
MCH: 34 pg (ref 26.0–34.0)
MCHC: 32.7 g/dL (ref 30.0–36.0)
MCV: 104 fL — AB (ref 80.0–100.0)
MONO ABS: 0.8 10*3/uL (ref 0.1–1.0)
MONOS PCT: 9 %
NEUTROS ABS: 4.4 10*3/uL (ref 1.7–7.7)
Neutrophils Relative %: 48 %
Platelets: 286 10*3/uL (ref 150–400)
RBC: 3.26 MIL/uL — ABNORMAL LOW (ref 3.87–5.11)
RDW: 14.2 % (ref 11.5–15.5)
WBC: 9.3 10*3/uL (ref 4.0–10.5)
nRBC: 0 % (ref 0.0–0.2)

## 2018-10-23 LAB — COMPREHENSIVE METABOLIC PANEL
ALBUMIN: 3.2 g/dL — AB (ref 3.5–5.0)
ALT: 26 U/L (ref 0–44)
ANION GAP: 8 (ref 5–15)
AST: 33 U/L (ref 15–41)
Alkaline Phosphatase: 74 U/L (ref 38–126)
BILIRUBIN TOTAL: 0.3 mg/dL (ref 0.3–1.2)
BUN: 28 mg/dL — AB (ref 6–20)
CO2: 28 mmol/L (ref 22–32)
Calcium: 9.1 mg/dL (ref 8.9–10.3)
Chloride: 106 mmol/L (ref 98–111)
Creatinine, Ser: 0.96 mg/dL (ref 0.44–1.00)
GFR calc Af Amer: >60 mL/min (ref >60)
GFR calc non Af Amer: >60 mL/min (ref >60)
GLUCOSE: 114 mg/dL — AB (ref 70–99)
Potassium: 3.6 mmol/L (ref 3.5–5.1)
Sodium: 142 mmol/L (ref 135–145)
TOTAL PROTEIN: 6.5 g/dL (ref 6.5–8.1)

## 2018-10-23 LAB — URINALYSIS, COMPLETE (UACMP) WITH MICROSCOPIC
BILIRUBIN URINE: NEGATIVE
Glucose, UA: NEGATIVE mg/dL
Hgb urine dipstick: NEGATIVE
Ketones, ur: NEGATIVE mg/dL
Nitrite: NEGATIVE
PH: 7 (ref 5.0–8.0)
Protein, ur: NEGATIVE mg/dL
Specific Gravity, Urine: 1.021 (ref 1.005–1.030)

## 2018-10-23 LAB — URINE DRUG SCREEN, QUALITATIVE (ARMC ONLY)
Amphetamines, Ur Screen: NOT DETECTED
BENZODIAZEPINE, UR SCRN: POSITIVE — AB
Barbiturates, Ur Screen: NOT DETECTED
CANNABINOID 50 NG, UR ~~LOC~~: NOT DETECTED
Cocaine Metabolite,Ur ~~LOC~~: NOT DETECTED
MDMA (Ecstasy)Ur Screen: POSITIVE — AB
METHADONE SCREEN, URINE: NOT DETECTED
OPIATE, UR SCREEN: NOT DETECTED
Phencyclidine (PCP) Ur S: NOT DETECTED
TRICYCLIC, UR SCREEN: POSITIVE — AB

## 2018-10-23 LAB — ACETAMINOPHEN LEVEL: Acetaminophen (Tylenol), Serum: <10 ug/mL — ABNORMAL LOW (ref 10–30)

## 2018-10-23 LAB — SALICYLATE LEVEL

## 2018-10-23 LAB — ETHANOL: Alcohol, Ethyl (B): <10 mg/dL (ref <10)

## 2018-10-23 MED ORDER — IBUPROFEN 600 MG PO TABS
ORAL_TABLET | ORAL | Status: AC
  Filled 2018-10-23: qty 1

## 2018-10-23 MED ORDER — NITROFURANTOIN MONOHYD MACRO 100 MG PO CAPS: 100.0000 mg | ORAL_CAPSULE | Freq: Two times a day (BID) | ORAL | 0 refills | Status: DC

## 2018-10-23 MED ORDER — IBUPROFEN 600 MG PO TABS
600.0000 mg | ORAL_TABLET | Freq: Once | ORAL | Status: AC
  Administered 2018-10-23: 600 mg via ORAL

## 2018-10-23 MED ORDER — IBUPROFEN 600 MG PO TABS
600.0000 mg | ORAL_TABLET | Freq: Once | ORAL | Status: AC
  Administered 2018-10-23: 600 mg via ORAL
  Filled 2018-10-23: qty 1

## 2018-10-23 MED ORDER — NITROFURANTOIN MONOHYD MACRO 100 MG PO CAPS
100.0000 mg | ORAL_CAPSULE | Freq: Once | ORAL | Status: AC
  Administered 2018-10-23: 100 mg via ORAL
  Filled 2018-10-23: qty 1

## 2018-10-23 NOTE — ED Notes (Signed)
Meal tray given to pt. Pt reports that she is excited to go home but requests to speak to doctor about starting her on home psych meds. Denies SI/HI. Reports not sleeping well. AOx4. Cooperative, speaking clearly, and denies pain. VSS.

## 2018-10-23 NOTE — Discharge Instructions (Addendum)
Today you were seen and evaluated by a board-certified psychiatrist who felt it was safe for you to go home and continue all of your home medications.  Please follow-up with your psychiatrist as an outpatient as scheduled and return to the emergency department for any concerns.  It was a pleasure to take care of you today, and thank you for coming to our emergency department.  If you have any questions or concerns before leaving please ask the nurse to grab me and I'm more than happy to go through your aftercare instructions again.  If you were prescribed any opioid pain medication today such as Norco, Vicodin, Percocet, morphine, hydrocodone, or oxycodone please make sure you do not drive when you are taking this medication as it can alter your ability to drive safely.  If you have any concerns once you are home that you are not improving or are in fact getting worse before you can make it to your follow-up appointment, please do not hesitate to call 911 and come back for further evaluation.  Merrily Brittle, MD  Results for orders placed or performed during the hospital encounter of 10/22/18  Comprehensive metabolic panel  Result Value Ref Range   Sodium 142 135 - 145 mmol/L   Potassium 3.6 3.5 - 5.1 mmol/L   Chloride 106 98 - 111 mmol/L   CO2 28 22 - 32 mmol/L   Glucose, Bld 114 (H) 70 - 99 mg/dL   BUN 28 (H) 6 - 20 mg/dL   Creatinine, Ser 1.61 0.44 - 1.00 mg/dL   Calcium 9.1 8.9 - 09.6 mg/dL   Total Protein 6.5 6.5 - 8.1 g/dL   Albumin 3.2 (L) 3.5 - 5.0 g/dL   AST 33 15 - 41 U/L   ALT 26 0 - 44 U/L   Alkaline Phosphatase 74 38 - 126 U/L   Total Bilirubin 0.3 0.3 - 1.2 mg/dL   GFR calc non Af Amer >60 >60 mL/min   GFR calc Af Amer >60 >60 mL/min   Anion gap 8 5 - 15  Ethanol  Result Value Ref Range   Alcohol, Ethyl (B) <10 <10 mg/dL  Acetaminophen level  Result Value Ref Range   Acetaminophen (Tylenol), Serum <10 (L) 10 - 30 ug/mL  Salicylate level  Result Value Ref Range   Salicylate Lvl <7.0 2.8 - 30.0 mg/dL  CBC with Differential  Result Value Ref Range   WBC 9.3 4.0 - 10.5 K/uL   RBC 3.26 (L) 3.87 - 5.11 MIL/uL   Hemoglobin 11.1 (L) 12.0 - 15.0 g/dL   HCT 04.5 (L) 40.9 - 81.1 %   MCV 104.0 (H) 80.0 - 100.0 fL   MCH 34.0 26.0 - 34.0 pg   MCHC 32.7 30.0 - 36.0 g/dL   RDW 91.4 78.2 - 95.6 %   Platelets 286 150 - 400 K/uL   nRBC 0.0 0.0 - 0.2 %   Neutrophils Relative % 48 %   Neutro Abs 4.4 1.7 - 7.7 K/uL   Lymphocytes Relative 40 %   Lymphs Abs 3.7 0.7 - 4.0 K/uL   Monocytes Relative 9 %   Monocytes Absolute 0.8 0.1 - 1.0 K/uL   Eosinophils Relative 2 %   Eosinophils Absolute 0.2 0.0 - 0.5 K/uL   Basophils Relative 1 %   Basophils Absolute 0.1 0.0 - 0.1 K/uL   Immature Granulocytes 0 %   Abs Immature Granulocytes 0.04 0.00 - 0.07 K/uL  Urinalysis, Complete w Microscopic  Result Value Ref Range  Color, Urine YELLOW (A) YELLOW   APPearance CLOUDY (A) CLEAR   Specific Gravity, Urine 1.021 1.005 - 1.030   pH 7.0 5.0 - 8.0   Glucose, UA NEGATIVE NEGATIVE mg/dL   Hgb urine dipstick NEGATIVE NEGATIVE   Bilirubin Urine NEGATIVE NEGATIVE   Ketones, ur NEGATIVE NEGATIVE mg/dL   Protein, ur NEGATIVE NEGATIVE mg/dL   Nitrite NEGATIVE NEGATIVE   Leukocytes, UA TRACE (A) NEGATIVE   RBC / HPF 0-5 0 - 5 RBC/hpf   WBC, UA 11-20 0 - 5 WBC/hpf   Bacteria, UA RARE (A) NONE SEEN   Squamous Epithelial / LPF 0-5 0 - 5   Mucus PRESENT    Amorphous Crystal PRESENT   Urine Drug Screen, Qualitative  Result Value Ref Range   Tricyclic, Ur Screen POSITIVE (A) NONE DETECTED   Amphetamines, Ur Screen NONE DETECTED NONE DETECTED   MDMA (Ecstasy)Ur Screen POSITIVE (A) NONE DETECTED   Cocaine Metabolite,Ur [place] NONE DETECTED NONE DETECTED   Opiate, Ur Screen NONE DETECTED NONE DETECTED   Phencyclidine (PCP) Ur S NONE DETECTED NONE DETECTED   Cannabinoid 50 Ng, Ur [place] NONE DETECTED NONE DETECTED   Barbiturates, Ur Screen NONE DETECTED NONE DETECTED   Benzodiazepine,  Ur Scrn POSITIVE (A) NONE DETECTED   Methadone Scn, Ur NONE DETECTED NONE DETECTED

## 2018-10-23 NOTE — ED Triage Notes (Signed)
Pt. States having SI thoughts.  Pt. States recent family loss

## 2018-10-23 NOTE — ED Notes (Signed)
SOC recommends discharge. 

## 2018-10-23 NOTE — ED Notes (Signed)
Called [facility] [phone] [facility] answered and stated she was the administrator.  Stated patient could be picked up at 10:30 am this morning.

## 2018-10-23 NOTE — ED Notes (Signed)
Group home staff made aware of Rx and discharge instructions.

## 2018-10-23 NOTE — ED Notes (Signed)
TTS talking to patient at this time. 

## 2018-10-23 NOTE — ED Provider Notes (Signed)
Patient complained of dysuria, I reviewed her urinalysis, likely consistent with urinary tract infection.  She has a penicillin allergy.  I will give her a dose of Macrobid and send her home with Macrobid.  I have added on a culture.   Governor Rooks, MD 10/23/18 1037

## 2018-10-23 NOTE — ED Notes (Signed)
Pt. States SI thoughts due to recent death of here uncle.  Pt. States she stays at a Group home called "[facility]".  Pt. States someone at group home called EMS.  Pt. States she cut her lt. Wrist with a razor.  Pt. Has superficial cut to lt. Wrist, scant amount of blood.

## 2018-10-23 NOTE — BH Assessment (Signed)
Assessment Note  Shelly Anderson is an 47 y.o. female who presents to the ER from her Care Home, due to suicidal gesture. Patient reports of having thoughts of ending her life because her mother informed her, her uncle passed. Patient states she recently found out her uncle was diagnosed with cancer and he passed today. Patient attempted to cut her wrist and the cut requires no sutures or medical attention.  Patient further reports, she and her uncle were close. 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 As an adult, they would still contact each other via phone calls. The uncle lived in [place].  During the interview, the patient was calm, cooperative and pleasant. She was able to provide appropriate answers to the questions. She denies having a history of violence and aggression as well as no involvement with the legal system. Patient reports of having past hospitalizations due to depression. Per her report, she was inpatient at "[facility]) a long time ago." She was unable to give the dates but shares she was there for three years. She currently receive medication management through [facility], in [place] and attend the [employer] program with "[facility]."   Diagnosis: Depression  Past Medical History:  Past Medical History:  Diagnosis Date  . Anemia   . COPD (chronic obstructive pulmonary disease) (HCC)   . Diabetes mellitus without complication (HCC)   . GERD (gastroesophageal reflux disease)   . Neuropathy   . Seizures (HCC)     Past Surgical History:  Procedure Laterality Date  . KNEE SURGERY      Family History: History reviewed. No pertinent family history.  Social History:  reports that she has been smoking. She has never used smokeless tobacco. She reports that she does not drink alcohol. Her drug history is not on file.  Additional Social History:  Alcohol / Drug Use Pain  Medications: See PTA mediction lsit Prescriptions: See PTA mediction list Over the Counter: See PTA medication list History of alcohol / drug use?: No history of alcohol / drug abuse Longest period of sobriety (when/how long): Reports of no past or current use Negative Consequences of Use: (reports of none) Withdrawal Symptoms: (reports of none)  CIWA: CIWA-Ar BP: 126/81 Pulse Rate: 99 COWS:    Allergies:  Allergies  Allergen Reactions  . Bee Venom Other (See Comments)    Throat swelling  . Coconut Oil Anaphylaxis  . Eggs Or Egg-Derived Products Rash    Throat swelling   . Penicillin G Rash    Has patient had a PCN reaction causing immediate rash, facial/tongue/throat swelling, SOB or lightheadedness with hypotension: Yes Has patient had a PCN reaction causing severe rash involving mucus membranes or skin necrosis: No Has patient had a PCN reaction that required hospitalization: Yes Has patient had a PCN reaction occurring within the last 10 years: Yes If all of the above answers are "NO", then may proceed with Cephalosporin use.   Marland Kitchen Phenytoin Sodium Extended     Other reaction(s): Unknown    Home Medications:  (Not in a hospital admission)  OB/GYN Status:  No LMP recorded. Patient has had a hysterectomy.  General Assessment Data Location of Assessment: [facility] ED TTS Assessment: In system Is this a Tele or Face-to-Face Assessment?: Face-to-Face Is this an Initial Assessment or a Re-assessment for this encounter?: Initial Assessment Language Other than English: No Living Arrangements: In Group Home: (  Comment: Name of Group Home)([facility]) What gender do you identify as?: Female Marital status: Single Pregnancy Status: No Living Arrangements: Group Home Can pt return to current living arrangement?: Yes Admission Status: Voluntary Is patient capable of signing voluntary admission?: Yes Referral Source: Self/Family/Friend Insurance type: [employer]  Medicare  Medical Screening Exam [facility] Walk-in ONLY) Medical Exam completed: Yes  Crisis Care Plan Living Arrangements: Group Home Name of Psychiatrist: [facility] Name of Therapist: [facility])  Education Status Is patient currently in school?: No Is the patient employed, unemployed or receiving disability?: Unemployed, Receiving disability income  Risk to self with the past 6 months Suicidal Ideation: No-Not Currently/Within Last 6 Months Has patient been a risk to self within the past 6 months prior to admission? : Yes Suicidal Intent: Yes-Currently Present Has patient had any suicidal intent within the past 6 months prior to admission? : Yes Is patient at risk for suicide?: Yes Suicidal Plan?: No-Not Currently/Within Last 6 Months Has patient had any suicidal plan within the past 6 months prior to admission? : Yes Access to Means: Yes Specify Access to Suicidal Means: Cut herself What has been your use of drugs/alcohol within the last 12 months?: Reports of none Previous Attempts/Gestures: Yes How many times?: 3 Other Self Harm Risks: Reports of none Triggers for Past Attempts: Family contact Intentional Self Injurious Behavior: None Family Suicide History: Unknown Persecutory voices/beliefs?: No Depression: Yes Depression Symptoms: Insomnia, Tearfulness, Isolating, Feeling worthless/self pity Substance abuse history and/or treatment for substance abuse?: No Suicide prevention information given to non-admitted patients: Not applicable  Risk to Others within the past 6 months Homicidal Ideation: No Does patient have any lifetime risk of violence toward others beyond the six months prior to admission? : No Thoughts of Harm to Others: No Current Homicidal Intent: No Current Homicidal Plan: No Access to Homicidal Means: No Identified Victim: Reports of none History of harm to others?: No Assessment of Violence: None Noted Violent Behavior  Description: Reports of none Does patient have access to weapons?: No Criminal Charges Pending?: No Does patient have a court date: No Is patient on probation?: No  Psychosis Hallucinations: None noted Delusions: None noted  Mental Status Report Appearance/Hygiene: Unremarkable, In scrubs Eye Contact: Good Motor Activity: Freedom of movement, Unremarkable Speech: Logical/coherent, Unremarkable Level of Consciousness: Alert Mood: Anxious, Sad, Pleasant Affect: Appropriate to circumstance, Depressed, Sad Anxiety Level: Minimal Thought Processes: Coherent, Relevant Judgement: Unimpaired Orientation: Person, Place, Time, Situation, Appropriate for developmental age Obsessive Compulsive Thoughts/Behaviors: Minimal  Cognitive Functioning Concentration: Normal Memory: Recent Intact, Remote Intact Is patient IDD: No Insight: Fair Impulse Control: Fair Appetite: Good Have you had any weight changes? : Gain Amount of the weight change? (lbs): 15 lbs Sleep: No Change Total Hours of Sleep: 4 Vegetative Symptoms: None  ADLScreening [facility] Assessment Services) Patient's cognitive ability adequate to safely complete daily activities?: Yes Patient able to express need for assistance with ADLs?: Yes Independently performs ADLs?: Yes (appropriate for developmental age)  Prior Inpatient Therapy Prior Inpatient Therapy: Yes Prior Therapy Dates: Unable to remember dates Prior Therapy Facilty/Provider(s): "[facility]" Reason for Treatment: Depression  Prior Outpatient Therapy Prior Outpatient Therapy: Yes Prior Therapy Dates: Current Prior Therapy Facilty/Provider(s): [facility] Reason for Treatment: Medication Management Does patient have an ACCT team?: No Does patient have Intensive In-House Services?  : No Does patient have [facility] services? : No Does patient have P4CC services?: No  ADL Screening (condition at time of admission) Patient's cognitive ability  adequate to  safely complete daily activities?: Yes Is the patient deaf or have difficulty hearing?: No Does the patient have difficulty seeing, even when wearing glasses/contacts?: No Does the patient have difficulty concentrating, remembering, or making decisions?: No Patient able to express need for assistance with ADLs?: Yes Does the patient have difficulty dressing or bathing?: No Independently performs ADLs?: Yes (appropriate for developmental age) Does the patient have difficulty walking or climbing stairs?: No Weakness of Legs: None Weakness of Arms/Hands: None  Home Assistive Devices/Equipment Home Assistive Devices/Equipment: None  Therapy Consults (therapy consults require a physician order) PT Evaluation Needed: No OT Evalulation Needed: No SLP Evaluation Needed: No Abuse/Neglect Assessment (Assessment to be complete while patient is alone) Abuse/Neglect Assessment Can Be Completed: Yes Physical Abuse: Denies Verbal Abuse: Denies Sexual Abuse: Denies Exploitation of patient/patient's resources: Denies Self-Neglect: Denies Values / Beliefs Cultural Requests During Hospitalization: None Spiritual Requests During Hospitalization: None Consults Spiritual Care Consult Needed: No Social Work Consult Needed: No         Child/Adolescent Assessment Running Away Risk: Denies(Patient is an adult)  Disposition:  Disposition Initial Assessment Completed for this Encounter: Yes  On Site Evaluation by:   Reviewed with Physician:    Lilyan Gilford MS, LCAS, LPC, NCC, CCSI Therapeutic Triage Specialist 10/23/2018 1:43 AM

## 2018-10-23 NOTE — ED Notes (Signed)
SOC called report given.  SOC Machine set up in room, pt. Woke and given some OJ.

## 2018-10-24 LAB — URINE CULTURE: Culture: >=100000 — AB

## 2018-10-25 ENCOUNTER — Ambulatory Visit: Payer: Medicare HMO | Admitting: Podiatry

## 2018-12-05 ENCOUNTER — Other Ambulatory Visit: Payer: Self-pay

## 2018-12-05 ENCOUNTER — Emergency Department: Payer: Medicare HMO

## 2018-12-05 ENCOUNTER — Encounter: Payer: Self-pay | Admitting: Emergency Medicine

## 2018-12-05 ENCOUNTER — Emergency Department
Admission: EM | Admit: 2018-12-05 | Discharge: 2018-12-06 | Disposition: A | Discharge status: Home / Self Care | Payer: Medicare HMO | Attending: Emergency Medicine | Admitting: Emergency Medicine

## 2018-12-05 DIAGNOSIS — W228XXA Striking against or struck by other objects, initial encounter: Secondary | ICD-10-CM | POA: Insufficient documentation

## 2018-12-05 DIAGNOSIS — Y999 Unspecified external cause status: Secondary | ICD-10-CM | POA: Diagnosis not present

## 2018-12-05 DIAGNOSIS — S43004A Unspecified dislocation of right shoulder joint, initial encounter: Secondary | ICD-10-CM | POA: Diagnosis not present

## 2018-12-05 DIAGNOSIS — E119 Type 2 diabetes mellitus without complications: Secondary | ICD-10-CM | POA: Diagnosis not present

## 2018-12-05 DIAGNOSIS — S4991XA Unspecified injury of right shoulder and upper arm, initial encounter: Secondary | ICD-10-CM | POA: Diagnosis present

## 2018-12-05 DIAGNOSIS — J449 Chronic obstructive pulmonary disease, unspecified: Secondary | ICD-10-CM | POA: Insufficient documentation

## 2018-12-05 DIAGNOSIS — Z79899 Other long term (current) drug therapy: Secondary | ICD-10-CM | POA: Diagnosis not present

## 2018-12-05 DIAGNOSIS — Y939 Activity, unspecified: Secondary | ICD-10-CM | POA: Diagnosis not present

## 2018-12-05 DIAGNOSIS — F17210 Nicotine dependence, cigarettes, uncomplicated: Secondary | ICD-10-CM | POA: Insufficient documentation

## 2018-12-05 DIAGNOSIS — Y929 Unspecified place or not applicable: Secondary | ICD-10-CM | POA: Diagnosis not present

## 2018-12-05 MED ORDER — HYDROMORPHONE HCL 1 MG/ML IJ SOLN
1.0000 mg | Freq: Once | INTRAMUSCULAR | Status: AC
  Administered 2018-12-05: 1 mg via INTRAVENOUS
  Filled 2018-12-05: qty 1

## 2018-12-05 MED ORDER — OXYCODONE-ACETAMINOPHEN 5-325 MG PO TABS: 1.0000 | ORAL_TABLET | Freq: Four times a day (QID) | ORAL | 0 refills | Status: DC | PRN

## 2018-12-05 NOTE — ED Triage Notes (Signed)
Pt says she was sitting and the kitchen table when her cigarettes fell out of her pocket; bent over to the right side to pick them up and the chair slipped out from under her;pt fell to the right side; pt with pain to right shoulder, step-off noted; pt unable to move arm; strong pulse

## 2018-12-05 NOTE — ED Notes (Signed)
Patient is lying on left side on the stretcher. Patient is using right hand to dial phone, but doesn't lift the arm away from her body.

## 2018-12-05 NOTE — ED Notes (Signed)
Patient taken to imaging. 

## 2018-12-05 NOTE — ED Notes (Signed)
Two RN attempted to obtain IV access without success. Will defer to another RN. MD aware,

## 2018-12-05 NOTE — ED Provider Notes (Signed)
[facility] Emergency Department Provider Note  ____________________________________________   I have reviewed the triage vital signs and the nursing notes. Where available I have reviewed prior notes and, if possible and indicated, outside hospital notes.    HISTORY  Chief Complaint Shoulder Pain    HPI Shelly Anderson is a 47 y.o. female  with a history of recurrent shoulder dislocations and some degree of it appears baseline subluxation from the prior note from orthopedics states that she tripped and bumped her shoulder.  Did not hit her head or pass out.  She states that happened earlier today and she thinks her shoulder is back out.  She has had no numbness no weakness.  No other complaints.  No surgery.    Past Medical History:  Diagnosis Date  . Anemia   . COPD (chronic obstructive pulmonary disease) (HCC)   . Diabetes mellitus without complication (HCC)   . GERD (gastroesophageal reflux disease)   . Neuropathy   . Seizures [facility])     Patient Active Problem List   Diagnosis Date Noted  . Suicidal ideation 03/05/2018  . Depression     Past Surgical History:  Procedure Laterality Date  . KNEE SURGERY      Prior to Admission medications   Medication Sig Start Date End Date Taking? Authorizing Provider  acetaminophen (TYLENOL) 500 MG tablet Take 1,000 mg by mouth every 8 (eight) hours as needed for mild pain.   Yes [provider]  albuterol (PROVENTIL HFA;VENTOLIN HFA) 108 (90 Base) MCG/ACT inhaler Inhale 1-2 puffs into the lungs every 6 (six) hours as needed for wheezing or shortness of breath.   Yes [provider]  atorvastatin (LIPITOR) 40 MG tablet Take 40 mg by mouth daily at 6 PM.    Yes [provider]  calcium carbonate (TUMS - DOSED IN MG ELEMENTAL CALCIUM) 500 MG chewable tablet Chew 1 tablet by mouth 2 (two) times daily.    Yes [provider]  clotrimazole (LOTRIMIN) 1 % cream Apply 1  application topically 2 (two) times daily.   Yes [provider]  cyclobenzaprine (FLEXERIL) 10 MG tablet Take 10 mg by mouth at bedtime as needed for muscle spasms.   Yes [provider]  divalproex (DEPAKOTE) 250 MG DR tablet Take 250 mg by mouth 2 (two) times daily.   Yes [provider]  divalproex (DEPAKOTE) 500 MG DR tablet Take 500-1,000 mg by mouth 2 (two) times daily.   Yes [provider]  DULoxetine (CYMBALTA) 60 MG capsule Take 60 mg by mouth daily.   Yes [provider]  fenofibrate (TRICOR) 145 MG tablet Take 145 mg by mouth daily.   Yes [provider]  fluticasone (FLONASE) 50 MCG/ACT nasal spray Place 1 spray into both nostrils daily.   Yes [provider]  hydrOXYzine (VISTARIL) 25 MG capsule Take 25 mg by mouth at bedtime.   Yes [provider]  LORazepam (ATIVAN) 0.5 MG tablet Take 0.5 mg by mouth 2 (two) times daily.    Yes [provider]  Melatonin 3 MG TABS Take 3 mg by mouth daily.   Yes [provider]  meloxicam (MOBIC) 15 MG tablet Take 15 mg by mouth daily.   Yes [provider]  mirtazapine (REMERON) 15 MG tablet Take 7.5 mg by mouth at bedtime.   Yes [provider]  polyethylene glycol (MIRALAX / GLYCOLAX) packet Take 17 g by mouth daily as needed for mild constipation.  Yes [provider]  QUEtiapine (SEROQUEL XR) 300 MG 24 hr tablet Take 300 mg by mouth 2 (two) times daily.   Yes [provider]  traMADol (ULTRAM) 50 MG tablet Take 50 mg by mouth daily as needed for moderate pain.   Yes [provider]  varenicline (CHANTIX) 1 MG tablet Take 1 mg by mouth daily.   Yes [provider]    Allergies Bee venom; Coconut oil; Eggs or egg-derived products; Penicillin g; Tetanus toxoids; and Phenytoin sodium extended  History reviewed. No pertinent family history.  Social History Social History   Tobacco Use  . Smoking  status: Current Every Day Smoker    Packs/day: 0.50    Types: Cigarettes  . Smokeless tobacco: Never Used  Substance Use Topics  . Alcohol use: No  . Drug use: Never    Review of Systems Constitutional: No fever/chills Eyes: No visual changes. ENT: No sore throat. No stiff neck no neck pain Cardiovascular: Denies chest pain. Respiratory: Denies shortness of breath. Gastrointestinal:   no vomiting.  No diarrhea.  No constipation. Genitourinary: Negative for dysuria. Musculoskeletal: Negative lower extremity swelling Skin: Negative for rash. Neurological: Negative for severe headaches, focal weakness or numbness.   ____________________________________________   PHYSICAL EXAM:  VITAL SIGNS: ED Triage Vitals [12/05/18 2029]  Enc Vitals Group     BP 126/79     Pulse Rate (!) 105     Resp (!) 21     Temp 98.8 F (37.1 C)     Temp Source Oral     SpO2 98 %     Weight 200 lb (90.7 kg)     Height 5\' 2"  (1.575 m)     Head Circumference      Peak Flow      Pain Score 10     Pain Loc      Pain Edu?      Excl. in GC?     Constitutional: Alert and oriented. Well appearing and in no acute distress.  Talking on the phone. Eyes: Conjunctivae are normal Head: Atraumatic HEENT: No congestion/rhinnorhea. Mucous membranes are moist.  Oropharynx non-erythematous Neck:   Nontender with no meningismus, no masses, no stridor Cardiovascular: Normal rate, regular rhythm. Grossly normal heart sounds.  Good peripheral circulation. Respiratory: Normal respiratory effort.  No retractions. Lungs CTAB. Abdominal: Soft and nontender. No distention. No guarding no rebound Back:  There is no focal tenderness or step off.  there is no midline tenderness there are no lesions noted. there is no CVA tenderness Musculoskeletal: No lower extremity tenderness, positive right shoulder discomfort with questionable deformity, obesity Shields exam no joint effusions, no DVT signs strong distal pulses no  edema Neurologic:  Normal speech and language. No gross focal neurologic deficits are appreciated.    ____________________________________________   LABS (all labs ordered are listed, but only abnormal results are displayed)  Labs Reviewed - No data to display  Pertinent labs  results that were available during my care of the patient were reviewed by me and considered in my medical decision making (see chart for details). ____________________________________________  EKG  I personally interpreted any EKGs ordered by me or triage  ____________________________________________  RADIOLOGY  Pertinent labs & imaging results that were available during my care of the patient were reviewed by me and considered in my medical decision making (see chart for details). If possible, patient and/or family made aware of any abnormal findings.  Dg Shoulder Right  Result Date: 12/05/2018 CLINICAL  DATA:  Postreduction EXAM: RIGHT SHOULDER - 2+ VIEW COMPARISON:  None. FINDINGS: There appears to have been reduction of the previously seen dislocation of the right shoulder. Advanced degenerative changes in the glenohumeral joint. No visible fracture. IMPRESSION: Interval reduction.  No visible fracture. Electronically Signed   By: Charlett NoseKevin  Dover M.D.   On: 12/05/2018 22:53   Dg Shoulder Right  Result Date: 12/05/2018 CLINICAL DATA:  Right shoulder pain and limited range of motion after fall. EXAM: RIGHT SHOULDER - 2+ VIEW COMPARISON:  Radiograph 06/15/2018, CT 09/02/2017 FINDINGS: Anterior inferior dislocation of the humeral head with respect to the glenoid. Advanced glenohumeral osteoarthritis again seen. Calcific tendinopathy as seen on prior exam, prior CT demonstrating supraspinatus calcifications. Acromioclavicular joint is congruent. IMPRESSION: Anterior inferior dislocation of the humeral head with respect to the glenoid. Advanced glenohumeral osteoarthritis. Electronically Signed   By: Narda RutherfordMelanie  Sanford  M.D.   On: 12/05/2018 21:09   ____________________________________________    PROCEDURES  Procedure(s) performed: None  .Ortho Injury Treatment Date/Time: 12/05/2018 11:17 PM Performed by: Jeanmarie PlantMcShane,  A, MD Authorized by: Jeanmarie PlantMcShane,  A, MD   Consent:    Consent obtained:  Verbal   Consent given by:  Patient   Risks discussed:  Vascular damage, irreducible dislocation, recurrent dislocation, stiffness, restricted joint movement and nerve damage   Alternatives discussed:  No treatment, alternative treatment, immobilization, referral and delayed treatmentInjury location: shoulder Injury type: dislocation Dislocation type: anterior Chronicity: recurrent Pre-procedure neurovascular assessment: neurovascularly intact Pre-procedure distal perfusion: normal Pre-procedure neurological function: normal Pre-procedure range of motion: normal  Anesthesia: Local anesthesia used: no  Patient sedated: NoManipulation performed: yes Reduction method: scapular manipulation Reduction successful: yes X-ray confirmed reduction: yes Immobilization: sling Post-procedure neurovascular assessment: post-procedure neurovascularly intact Post-procedure distal perfusion: normal Post-procedure neurological function: normal Post-procedure range of motion: normal Patient tolerance: Patient tolerated the procedure well with no immediate complications     Critical Care performed: None  ___________________________________________   INITIAL IMPRESSION / ASSESSMENT AND PLAN / ED COURSE  Pertinent labs & imaging results that were available during my care of the patient were reviewed by me and considered in my medical decision making (see chart for details).  Patient here with a shoulder dislocation, she has had multiple different shoulder dislocations and I figured that her shoulder would be likely not too difficult to reduce after talking to the patient she agreed to take pain medications, she  does have a ride home and try to avoid moderate sedation.  We there for gave her pain medications and using scapular manipulation and gentle traction we were able to reduce the shoulder with a satisfying pop.  X-ray shows reduction.  Patient remains neurovascularly intact.  Pacing her shoulder immobilizer.    ____________________________________________   FINAL CLINICAL IMPRESSION(S) / ED DIAGNOSES  Final diagnoses:  None      This chart was dictated using voice recognition software.  Despite best efforts to proofread,  errors can occur which can change meaning.      Jeanmarie PlantMcShane,  A, MD 12/05/18 [phone]

## 2018-12-05 NOTE — ED Notes (Signed)
Patient is able to move independently on stretcher and reposition self. Patient c/o pain when trying to lift right arm.

## 2018-12-06 DIAGNOSIS — S43004A Unspecified dislocation of right shoulder joint, initial encounter: Secondary | ICD-10-CM | POA: Diagnosis not present

## 2018-12-06 MED ORDER — OXYCODONE-ACETAMINOPHEN 5-325 MG PO TABS
1.0000 | ORAL_TABLET | Freq: Once | ORAL | Status: AC
  Administered 2018-12-06: 1 via ORAL
  Filled 2018-12-06: qty 1

## 2018-12-16 ENCOUNTER — Other Ambulatory Visit: Payer: Self-pay

## 2018-12-16 ENCOUNTER — Emergency Department
Admission: EM | Admit: 2018-12-16 | Discharge: 2018-12-16 | Disposition: A | Discharge status: Home / Self Care | Payer: Medicare HMO | Attending: Emergency Medicine | Admitting: Emergency Medicine

## 2018-12-16 ENCOUNTER — Emergency Department: Payer: Medicare HMO

## 2018-12-16 ENCOUNTER — Encounter: Payer: Self-pay | Admitting: Emergency Medicine

## 2018-12-16 DIAGNOSIS — Y998 Other external cause status: Secondary | ICD-10-CM | POA: Insufficient documentation

## 2018-12-16 DIAGNOSIS — Z79899 Other long term (current) drug therapy: Secondary | ICD-10-CM | POA: Insufficient documentation

## 2018-12-16 DIAGNOSIS — E119 Type 2 diabetes mellitus without complications: Secondary | ICD-10-CM | POA: Insufficient documentation

## 2018-12-16 DIAGNOSIS — W010XXA Fall on same level from slipping, tripping and stumbling without subsequent striking against object, initial encounter: Secondary | ICD-10-CM | POA: Diagnosis not present

## 2018-12-16 DIAGNOSIS — Y9389 Activity, other specified: Secondary | ICD-10-CM | POA: Insufficient documentation

## 2018-12-16 DIAGNOSIS — S4991XA Unspecified injury of right shoulder and upper arm, initial encounter: Secondary | ICD-10-CM | POA: Diagnosis not present

## 2018-12-16 DIAGNOSIS — Y9289 Other specified places as the place of occurrence of the external cause: Secondary | ICD-10-CM | POA: Diagnosis not present

## 2018-12-16 DIAGNOSIS — J449 Chronic obstructive pulmonary disease, unspecified: Secondary | ICD-10-CM | POA: Diagnosis not present

## 2018-12-16 MED ORDER — IBUPROFEN 600 MG PO TABS
600.0000 mg | ORAL_TABLET | Freq: Once | ORAL | Status: AC
  Administered 2018-12-16: 600 mg via ORAL
  Filled 2018-12-16: qty 1

## 2018-12-16 NOTE — ED Triage Notes (Signed)
Larey SeatFell this morning when getting off of the bus.  States "I dislocated my shoulder again".
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
## 2019-02-20 ENCOUNTER — Emergency Department
Admission: EM | Admit: 2019-02-20 | Discharge: 2019-02-20 | Disposition: A | Discharge status: Home / Self Care | Payer: Medicare HMO | Attending: Emergency Medicine | Admitting: Emergency Medicine

## 2019-02-20 ENCOUNTER — Other Ambulatory Visit: Payer: Self-pay

## 2019-02-20 DIAGNOSIS — Z79899 Other long term (current) drug therapy: Secondary | ICD-10-CM | POA: Insufficient documentation

## 2019-02-20 DIAGNOSIS — Y92010 Kitchen of single-family (private) house as the place of occurrence of the external cause: Secondary | ICD-10-CM | POA: Diagnosis not present

## 2019-02-20 DIAGNOSIS — X102XXA Contact with fats and cooking oils, initial encounter: Secondary | ICD-10-CM | POA: Insufficient documentation

## 2019-02-20 DIAGNOSIS — E1140 Type 2 diabetes mellitus with diabetic neuropathy, unspecified: Secondary | ICD-10-CM | POA: Insufficient documentation

## 2019-02-20 DIAGNOSIS — Y998 Other external cause status: Secondary | ICD-10-CM | POA: Diagnosis not present

## 2019-02-20 DIAGNOSIS — J449 Chronic obstructive pulmonary disease, unspecified: Secondary | ICD-10-CM | POA: Diagnosis not present

## 2019-02-20 DIAGNOSIS — T2121XA Burn of second degree of chest wall, initial encounter: Secondary | ICD-10-CM | POA: Insufficient documentation

## 2019-02-20 DIAGNOSIS — F17210 Nicotine dependence, cigarettes, uncomplicated: Secondary | ICD-10-CM | POA: Insufficient documentation

## 2019-02-20 DIAGNOSIS — S299XXA Unspecified injury of thorax, initial encounter: Secondary | ICD-10-CM | POA: Diagnosis present

## 2019-02-20 DIAGNOSIS — Y93G1 Activity, food preparation and clean up: Secondary | ICD-10-CM | POA: Insufficient documentation

## 2019-02-20 DIAGNOSIS — T300 Burn of unspecified body region, unspecified degree: Secondary | ICD-10-CM

## 2019-02-20 MED ORDER — TRAMADOL HCL 50 MG PO TABS: 50.0000 mg | ORAL_TABLET | Freq: Four times a day (QID) | ORAL | 0 refills | Status: DC | PRN

## 2019-02-20 MED ORDER — SILVER SULFADIAZINE 1 % EX CREA
TOPICAL_CREAM | Freq: Once | CUTANEOUS | Status: AC
  Administered 2019-02-20: 14:00:00 via TOPICAL
  Filled 2019-02-20: qty 85

## 2019-02-20 MED ORDER — SILVER SULFADIAZINE 1 % EX CREA: 1.0000 "application " | TOPICAL_CREAM | Freq: Every day | CUTANEOUS | 0 refills | Status: DC
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
## 2019-02-20 NOTE — Discharge Instructions (Signed)
Follow-up with your regular doctor for a recheck.  Return to the emergency department if worsening.  Use the burn cream daily.

## 2019-03-21 ENCOUNTER — Emergency Department
Admission: EM | Admit: 2019-03-21 | Discharge: 2019-03-21 | Disposition: A | Discharge status: Home / Self Care | Payer: Medicare HMO | Attending: Emergency Medicine | Admitting: Emergency Medicine

## 2019-03-21 ENCOUNTER — Emergency Department: Payer: Medicare HMO

## 2019-03-21 ENCOUNTER — Other Ambulatory Visit: Payer: Self-pay

## 2019-03-21 ENCOUNTER — Encounter: Payer: Self-pay | Admitting: Emergency Medicine

## 2019-03-21 DIAGNOSIS — W228XXA Striking against or struck by other objects, initial encounter: Secondary | ICD-10-CM | POA: Insufficient documentation

## 2019-03-21 DIAGNOSIS — Y9389 Activity, other specified: Secondary | ICD-10-CM | POA: Diagnosis not present

## 2019-03-21 DIAGNOSIS — E119 Type 2 diabetes mellitus without complications: Secondary | ICD-10-CM | POA: Insufficient documentation

## 2019-03-21 DIAGNOSIS — Y92002 Bathroom of unspecified non-institutional (private) residence single-family (private) house as the place of occurrence of the external cause: Secondary | ICD-10-CM | POA: Insufficient documentation

## 2019-03-21 DIAGNOSIS — F17210 Nicotine dependence, cigarettes, uncomplicated: Secondary | ICD-10-CM | POA: Insufficient documentation

## 2019-03-21 DIAGNOSIS — S6991XA Unspecified injury of right wrist, hand and finger(s), initial encounter: Secondary | ICD-10-CM | POA: Diagnosis present

## 2019-03-21 DIAGNOSIS — S63636A Sprain of interphalangeal joint of right little finger, initial encounter: Secondary | ICD-10-CM | POA: Insufficient documentation

## 2019-03-21 DIAGNOSIS — J449 Chronic obstructive pulmonary disease, unspecified: Secondary | ICD-10-CM | POA: Diagnosis not present

## 2019-03-21 DIAGNOSIS — Z79899 Other long term (current) drug therapy: Secondary | ICD-10-CM | POA: Insufficient documentation

## 2019-03-21 DIAGNOSIS — Y999 Unspecified external cause status: Secondary | ICD-10-CM | POA: Insufficient documentation

## 2019-03-21 MED ORDER — ACETAMINOPHEN 500 MG PO TABS: 1000.0000 mg | ORAL_TABLET | Freq: Three times a day (TID) | ORAL | 0 refills | Status: DC | PRN

## 2019-03-21 MED ORDER — ACETAMINOPHEN 500 MG PO TABS
1000.0000 mg | ORAL_TABLET | Freq: Once | ORAL | Status: AC
  Administered 2019-03-21: 1000 mg via ORAL
  Filled 2019-03-21: qty 2
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
## 2019-03-21 NOTE — ED Provider Notes (Signed)
[facility] Emergency Department Provider Note  ____________________________________________  Time seen: Approximately 9:52 PM  I have reviewed the triage vital signs and the nursing notes.   HISTORY  Chief Complaint Finger Injury    HPI Shelly Anderson is a 48 y.o. female who presents the emergency department complaining of pain to the pinky finger of her right hand.  Patient reports that she was attempting to flush the toilet when she jammed her finger against the commode.  Patient is reporting 10 out of 10 pain and inability to bend her finger.  She denies any other injury or complaint.  States she has not taken any medications to alleviate symptoms prior to arrival.  During HPI, patient is visualized eating a sandwich with both hands moving the fifth digit appropriately.  She is also using the TV remote to change channels using the finger appropriately.  Patient is known to myself and this department having had multiple previous visits.  On my last encounter with the patient, the patient's family member was in the room and states that the patient has exhibited on multiple occasions significant drug-seeking behavior and that the family is requesting the patient not receive any narcotics         Past Medical History:  Diagnosis Date  . Anemia   . COPD (chronic obstructive pulmonary disease) (HCC)   . Diabetes mellitus without complication (HCC)   . GERD (gastroesophageal reflux disease)   . Neuropathy   . Seizures [facility])     Patient Active Problem List   Diagnosis Date Noted  . Suicidal ideation 03/05/2018  . Depression     Past Surgical History:  Procedure Laterality Date  . KNEE SURGERY      Prior to Admission medications   Medication Sig Start Date End Date Taking? Authorizing Provider  acetaminophen (TYLENOL) 500 MG tablet Take 2 tablets (1,000 mg total) by mouth every 8 (eight) hours as needed. 03/21/19 03/20/20  Aizlyn Schifano, Delorise Royals, PA-C   albuterol (PROVENTIL HFA;VENTOLIN HFA) 108 (90 Base) MCG/ACT inhaler Inhale 1-2 puffs into the lungs every 6 (six) hours as needed for wheezing or shortness of breath.    [provider]  atorvastatin (LIPITOR) 40 MG tablet Take 40 mg by mouth daily at 6 PM.     [provider]  calcium carbonate (TUMS - DOSED IN MG ELEMENTAL CALCIUM) 500 MG chewable tablet Chew 1 tablet by mouth 2 (two) times daily.     [provider]  clotrimazole (LOTRIMIN) 1 % cream Apply 1 application topically 2 (two) times daily.    [provider]  cyclobenzaprine (FLEXERIL) 10 MG tablet Take 10 mg by mouth at bedtime as needed for muscle spasms.    [provider]  divalproex (DEPAKOTE) 250 MG DR tablet Take 250 mg by mouth 2 (two) times daily.    [provider]  divalproex (DEPAKOTE) 500 MG DR tablet Take 500-1,000 mg by mouth 2 (two) times daily.    [provider]  DULoxetine (CYMBALTA) 60 MG capsule Take 60 mg by mouth daily.    [provider]  fenofibrate (TRICOR) 145 MG tablet Take 145 mg by mouth daily.    [provider]  fluticasone (FLONASE) 50 MCG/ACT nasal spray Place 1 spray into both nostrils daily.    [provider]  hydrOXYzine (VISTARIL) 25 MG capsule Take 25 mg by mouth at bedtime.    [provider]  LORazepam (ATIVAN) 0.5 MG tablet Take 0.5 mg by  mouth 2 (two) times daily.     [provider]  Melatonin 3 MG TABS Take 3 mg by mouth daily.    [provider]  meloxicam (MOBIC) 15 MG tablet Take 15 mg by mouth daily.    [provider]  mirtazapine (REMERON) 15 MG tablet Take 7.5 mg by mouth at bedtime.    [provider]  oxyCODONE-acetaminophen (PERCOCET) 5-325 MG tablet Take 1 tablet by mouth every 6 (six) hours as needed for severe pain. 12/05/18   Jeanmarie Plant, MD  polyethylene glycol (MIRALAX / GLYCOLAX) packet Take 17 g by mouth daily as needed for mild  constipation.     [provider]  QUEtiapine (SEROQUEL XR) 300 MG 24 hr tablet Take 300 mg by mouth 2 (two) times daily.    [provider]  silver sulfADIAZINE (SILVADENE) 1 % cream Apply 1 application topically daily. 02/20/19   Fisher, Roselyn Bering, PA-C  traMADol (ULTRAM) 50 MG tablet Take 1 tablet (50 mg total) by mouth every 6 (six) hours as needed. 02/20/19   Fisher, Roselyn Bering, PA-C  varenicline (CHANTIX) 1 MG tablet Take 1 mg by mouth daily.    [provider]    Allergies Bee venom; Coconut oil; Eggs or egg-derived products; Penicillin g; Tetanus toxoids; and Phenytoin sodium extended  History reviewed. No pertinent family history.  Social History Social History   Tobacco Use  . Smoking status: Current Every Day Smoker    Packs/day: 0.50    Types: Cigarettes  . Smokeless tobacco: Never Used  Substance Use Topics  . Alcohol use: No  . Drug use: Never     Review of Systems  Constitutional: No fever/chills Eyes: No visual changes.  Cardiovascular: no chest pain. Respiratory: no cough. No SOB. Gastrointestinal: No abdominal pain.  No nausea, no vomiting.   Musculoskeletal: Positive for pain to the fifth digit of the right hand Skin: Negative for rash, abrasions, lacerations, ecchymosis. Neurological: Negative for headaches, focal weakness or numbness. 10-point ROS otherwise negative.  ____________________________________________   PHYSICAL EXAM:  VITAL SIGNS: ED Triage Vitals [03/21/19 2127]  Enc Vitals Group     BP 130/67     Pulse Rate 91     Resp 16     Temp 98.5 F (36.9 C)     Temp Source Oral     SpO2 97 %     Weight      Height      Head Circumference      Peak Flow      Pain Score      Pain Loc      Pain Edu?      Excl. in GC?      Constitutional: Alert and oriented. Well appearing and in no acute distress. Eyes: Conjunctivae are normal. PERRL. EOMI. Head: Atraumatic. Neck: No stridor.    Cardiovascular: Normal rate,  regular rhythm. Normal S1 and S2.  Good peripheral circulation. Respiratory: Normal respiratory effort without tachypnea or retractions. Lungs CTAB. Good air entry to the bases with no decreased or absent breath sounds. Musculoskeletal: Full range of motion to all extremities. No gross deformities appreciated.  Visualization of the right hand reveals no gross signs of trauma.  Patient on exam will not flex or extend the fifth digit however she was observed moving the digit freely while eating and watching TV and changing channels using the TV remote.  Patient endorses pain from the fifth metacarpal all the way through the distal aspect  of the digit.  Sensation is intact.  Capillary refill less than 2 seconds all digits. Neurologic:  Normal speech and language. No gross focal neurologic deficits are appreciated.  Skin:  Skin is warm, dry and intact. No rash noted. Psychiatric: Mood and affect are normal. Speech and behavior are normal. Patient exhibits appropriate insight and judgement.   ____________________________________________   LABS (all labs ordered are listed, but only abnormal results are displayed)  Labs Reviewed - No data to display ____________________________________________  EKG   ____________________________________________  RADIOLOGY I personally viewed and evaluated these images as part of my medical decision making, as well as reviewing the written report by the radiologist.  Dg Finger Little Right  Result Date: 03/21/2019 CLINICAL DATA:  Jammed pinky finger on toilet. EXAM: RIGHT LITTLE FINGER 2+V COMPARISON:  None. FINDINGS: There is no evidence of fracture or dislocation. There is no evidence of arthropathy or other focal bone abnormality. Soft tissues are unremarkable. IMPRESSION: Negative. Electronically Signed   By: Awilda Metro M.D.   On: 03/21/2019 21:51    ____________________________________________    PROCEDURES  Procedure(s) performed:     Procedures    Medications  acetaminophen (TYLENOL) tablet 1,000 mg (has no administration in time range)     ____________________________________________   INITIAL IMPRESSION / ASSESSMENT AND PLAN / ED COURSE  Pertinent labs & imaging results that were available during my care of the patient were reviewed by me and considered in my medical decision making (see chart for details).  Review of the [place] CSRS was performed in accordance of the [facility] prior to dispensing any controlled drugs.           Patient's diagnosis is consistent with jammed interphalangeal digit.  Patient presents emergency department with a complaint of pain to the fifth digit of her right hand.  Patient had jammed the finger while attempting to flush the toilet.  Exam was overall reassuring.  Patient denied the ability to flex or extend the digit however she was observed doing so effortlessly during her HPI.  No significant findings on physical exam.  X-ray reveals no acute fracture.  Tylenol for pain at home.  Follow-up with primary care as needed..  Patient is given ED precautions to return to the ED for any worsening or new symptoms.     ____________________________________________  FINAL CLINICAL IMPRESSION(S) / ED DIAGNOSES  Final diagnoses:  Jammed interphalangeal joint of finger of right hand, initial encounter      NEW MEDICATIONS STARTED DURING THIS VISIT:  ED Discharge Orders         Ordered    acetaminophen (TYLENOL) 500 MG tablet  Every 8 hours PRN     03/21/19 2159              This chart was dictated using voice recognition software/Dragon. Despite best efforts to proofread, errors can occur which can change the meaning. Any change was purely unintentional.    Racheal Patches, PA-C 03/21/19 2204    Minna Antis, MD 03/21/19 [phone]

## 2019-03-21 NOTE — ED Notes (Signed)
Was flushing toilet and patient states she thinks she spained her pinky finger on right hand. Patient unable to bend finger or straighten out either. Patient states pain is 10/10 in finger.

## 2019-04-21 ENCOUNTER — Emergency Department: Payer: Medicare HMO

## 2019-04-21 ENCOUNTER — Other Ambulatory Visit: Payer: Self-pay

## 2019-04-21 ENCOUNTER — Emergency Department
Admission: EM | Admit: 2019-04-21 | Discharge: 2019-04-21 | Disposition: A | Discharge status: Home / Self Care | Payer: Medicare HMO | Attending: Emergency Medicine | Admitting: Emergency Medicine

## 2019-04-21 DIAGNOSIS — Z79899 Other long term (current) drug therapy: Secondary | ICD-10-CM | POA: Diagnosis not present

## 2019-04-21 DIAGNOSIS — M25511 Pain in right shoulder: Secondary | ICD-10-CM | POA: Diagnosis present

## 2019-04-21 DIAGNOSIS — J449 Chronic obstructive pulmonary disease, unspecified: Secondary | ICD-10-CM | POA: Insufficient documentation

## 2019-04-21 DIAGNOSIS — R569 Unspecified convulsions: Secondary | ICD-10-CM | POA: Diagnosis not present

## 2019-04-21 DIAGNOSIS — E119 Type 2 diabetes mellitus without complications: Secondary | ICD-10-CM | POA: Diagnosis not present

## 2019-04-21 DIAGNOSIS — F17210 Nicotine dependence, cigarettes, uncomplicated: Secondary | ICD-10-CM | POA: Insufficient documentation

## 2019-04-21 NOTE — ED Notes (Signed)
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

## 2019-04-21 NOTE — ED Provider Notes (Signed)
[facility] Emergency Department Provider Note  ____________________________________________   I have reviewed the triage vital signs and the nursing notes.   HISTORY  Chief Complaint Extremity Pain   History limited by: Not Limited   HPI Shelly Anderson is a 48 y.o. female who presents to the emergency department today because of concern for right shoulder pain. Patient states that she had a seizure today and that she thinks she dislocated her right shoulder. The patient has a history of both seizures and right shoulder dislocations. The patient states that she thinks the seizure lasted roughly 1 minute. She denies missing any doses of her medication.   Records reviewed. Per medical record review patient has a history of COPD, seizures, right shoulder dislocation.  Past Medical History:  Diagnosis Date  . Anemia   . COPD (chronic obstructive pulmonary disease) (HCC)   . Diabetes mellitus without complication (HCC)   . GERD (gastroesophageal reflux disease)   . Neuropathy   . Seizures [facility])     Patient Active Problem List   Diagnosis Date Noted  . Suicidal ideation 03/05/2018  . Depression     Past Surgical History:  Procedure Laterality Date  . KNEE SURGERY      Prior to Admission medications   Medication Sig Start Date End Date Taking? Authorizing Provider  acetaminophen (TYLENOL) 500 MG tablet Take 2 tablets (1,000 mg total) by mouth every 8 (eight) hours as needed. 03/21/19 03/20/20  Cuthriell, Delorise Royals, PA-C  albuterol (PROVENTIL HFA;VENTOLIN HFA) 108 (90 Base) MCG/ACT inhaler Inhale 1-2 puffs into the lungs every 6 (six) hours as needed for wheezing or shortness of breath.    [provider]  atorvastatin (LIPITOR) 40 MG tablet Take 40 mg by mouth daily at 6 PM.     [provider]  calcium carbonate (TUMS - DOSED IN MG ELEMENTAL CALCIUM) 500 MG chewable tablet Chew 1 tablet by mouth 2 (two) times daily.     [provider]  clotrimazole (LOTRIMIN) 1 % cream Apply 1 application topically 2 (two) times daily.    [provider]  cyclobenzaprine (FLEXERIL) 10 MG tablet Take 10 mg by mouth at bedtime as needed for muscle spasms.    [provider]  divalproex (DEPAKOTE) 250 MG DR tablet Take 250 mg by mouth 2 (two) times daily.    [provider]  divalproex (DEPAKOTE) 500 MG DR tablet Take 500-1,000 mg by mouth 2 (two) times daily.    [provider]  DULoxetine (CYMBALTA) 60 MG capsule Take 60 mg by mouth daily.    [provider]  fenofibrate (TRICOR) 145 MG tablet Take 145 mg by mouth daily.    [provider]  fluticasone (FLONASE) 50 MCG/ACT nasal spray Place 1 spray into both nostrils daily.    [provider]  hydrOXYzine (VISTARIL) 25 MG capsule Take 25 mg by mouth at bedtime.    [provider]  LORazepam (ATIVAN) 0.5 MG tablet Take 0.5 mg by mouth 2 (two) times daily.     [provider]  Melatonin 3 MG TABS Take 3 mg by mouth daily.    [provider]  meloxicam (MOBIC) 15 MG tablet Take 15 mg by mouth daily.    [provider]  mirtazapine (REMERON) 15 MG tablet Take 7.5 mg by mouth at bedtime.    [provider]  oxyCODONE-acetaminophen (PERCOCET) 5-325 MG tablet Take 1 tablet by mouth every 6 (six) hours as needed for  severe pain. 12/05/18   Jeanmarie PlantMcShane, James A, MD  polyethylene glycol (MIRALAX / GLYCOLAX) packet Take 17 g by mouth daily as needed for mild constipation.     [provider]  QUEtiapine (SEROQUEL XR) 300 MG 24 hr tablet Take 300 mg by mouth 2 (two) times daily.    [provider]  silver sulfADIAZINE (SILVADENE) 1 % cream Apply 1 application topically daily. 02/20/19   Fisher, Roselyn BeringSusan W, PA-C  traMADol (ULTRAM) 50 MG tablet Take 1 tablet (50 mg total) by mouth every 6 (six) hours as needed. 02/20/19   Fisher, Roselyn BeringSusan W, PA-C  varenicline (CHANTIX) 1 MG tablet Take  1 mg by mouth daily.    [provider]    Allergies Bee venom; Coconut oil; Eggs or egg-derived products; Penicillin g; Tetanus toxoids; and Phenytoin sodium extended  History reviewed. No pertinent family history.  Social History Social History   Tobacco Use  . Smoking status: Current Every Day Smoker    Packs/day: 0.50    Types: Cigarettes  . Smokeless tobacco: Never Used  Substance Use Topics  . Alcohol use: No  . Drug use: Never    Review of Systems Constitutional: No fever/chills Eyes: No visual changes. ENT: No sore throat. Cardiovascular: Denies chest pain. Respiratory: Denies shortness of breath. Gastrointestinal: No abdominal pain.  No nausea, no vomiting.  No diarrhea.   Genitourinary: Negative for dysuria. Musculoskeletal: Positive for right shoulder pain.  Skin: Negative for rash. Neurological: Positive for seizure.  ____________________________________________   PHYSICAL EXAM:  VITAL SIGNS: ED Triage Vitals  Enc Vitals Group     BP 04/21/19 1742 (!) 127/94     Pulse Rate 04/21/19 1742 94     Resp 04/21/19 1742 18     Temp 04/21/19 1742 98.8 F (37.1 C)     Temp Source 04/21/19 1742 Oral     SpO2 04/21/19 1742 99 %     Weight 04/21/19 1743 200 lb (90.7 kg)     Height 04/21/19 1743 5\' 2"  (1.575 m)     Head Circumference --      Peak Flow --      Pain Score 04/21/19 1743 10   Constitutional: Alert and oriented.  Eyes: Conjunctivae are normal.  ENT      Head: Normocephalic and atraumatic.      Nose: No congestion/rhinnorhea.      Mouth/Throat: Mucous membranes are moist.      Neck: No stridor. Hematological/Lymphatic/Immunilogical: No cervical lymphadenopathy. Cardiovascular: Normal rate, regular rhythm.  No murmurs, rubs, or gallops. Respiratory: Normal respiratory effort without tachypnea nor retractions. Breath sounds are clear and equal bilaterally. No wheezes/rales/rhonchi. Gastrointestinal: Soft and non tender. No rebound. No  guarding.  Genitourinary: Deferred Musculoskeletal: Patient not moving right arm, stating pain was too great. No effort when asked to actively range shoulder. No deformity noted.  Neurologic:  Normal speech and language. No gross focal neurologic deficits are appreciated.  Skin:  Skin is warm, dry and intact. No rash noted. Psychiatric: Mood and affect are normal. Speech and behavior are normal. Patient exhibits appropriate insight and judgment.  ____________________________________________    LABS (pertinent positives/negatives)  None  ____________________________________________   EKG  None  ____________________________________________    RADIOLOGY  Right shoulder No dislocation or fracture, chronic low position of humeral head.  ____________________________________________   PROCEDURES  Procedures  ____________________________________________   INITIAL IMPRESSION / ASSESSMENT AND PLAN / ED COURSE  Pertinent labs & imaging results that were available during my care  of the patient were reviewed by me and considered in my medical decision making (see chart for details).   Patient presented to the emergency department today because of concerns for right shoulder pain after a seizure.  On my initial exam patient was refusing to move the right arm at all.  She complained of severe pain when I tried to passively move her shoulder.  X-rays however did not show any acute dislocation or fracture.  When I went back to discussed with patient that her x-rays appeared unchanged she was using her right arm to eat food.  She had no problem moving the right arm.  This point I doubt significant injury to that shoulder.  Terms of the seizure I had a discussion with the patient.  Did offer to perform blood work however she states she does get seizures about once every 1 to 3 months.  She felt comfortable deferring blood work at this time which I think is  reasonable.   ____________________________________________   FINAL CLINICAL IMPRESSION(S) / ED DIAGNOSES  Final diagnoses:  Right shoulder pain, unspecified chronicity  Seizure (HCC)     Note: This dictation was prepared with Dragon dictation. Any transcriptional errors that result from this process are unintentional     Phineas Semen, MD 04/21/19 1925

## 2019-04-21 NOTE — Discharge Instructions (Addendum)
Please seek medical attention for any high fevers, chest pain, shortness of breath, change in behavior, persistent vomiting, bloody stool or any other new or concerning symptoms.  

## 2019-04-21 NOTE — ED Notes (Signed)
Patient was sent to the ED by her PCP because patient had a tonic/clonic seizure.  Patient has history of seizures, but per group home staff, patient has never had a seizure like this before.  Patient was brought by her peer support worker.  Patient lives with care giver Lucinda [phone].  Patient is her own legal guardian.

## 2019-04-21 NOTE — ED Triage Notes (Signed)
Pt arrived c/o of right arm pain but she is unsure of when it started due to her having a seizure today. Pt goes to a day program called [facility] where the seizure was witnessed.

## 2019-07-24 ENCOUNTER — Ambulatory Visit (HOSPITAL_COMMUNITY)
Admission: AD | Admit: 2019-07-24 | Discharge: 2019-07-24 | Disposition: A | Discharge status: Home / Self Care | Payer: Medicare HMO | Attending: Psychiatry | Admitting: Psychiatry

## 2019-07-24 ENCOUNTER — Encounter (HOSPITAL_COMMUNITY): Payer: Self-pay | Admitting: Emergency Medicine

## 2019-07-24 ENCOUNTER — Emergency Department (HOSPITAL_COMMUNITY)
Admission: EM | Admit: 2019-07-24 | Discharge: 2019-07-25 | Disposition: A | Discharge status: Home / Self Care | Payer: Medicare HMO | Attending: Emergency Medicine | Admitting: Emergency Medicine

## 2019-07-24 ENCOUNTER — Other Ambulatory Visit: Payer: Self-pay

## 2019-07-24 DIAGNOSIS — Z79899 Other long term (current) drug therapy: Secondary | ICD-10-CM | POA: Insufficient documentation

## 2019-07-24 DIAGNOSIS — F314 Bipolar disorder, current episode depressed, severe, without psychotic features: Secondary | ICD-10-CM | POA: Diagnosis not present

## 2019-07-24 DIAGNOSIS — E119 Type 2 diabetes mellitus without complications: Secondary | ICD-10-CM | POA: Insufficient documentation

## 2019-07-24 DIAGNOSIS — Z880 Allergy status to penicillin: Secondary | ICD-10-CM | POA: Diagnosis not present

## 2019-07-24 DIAGNOSIS — F17210 Nicotine dependence, cigarettes, uncomplicated: Secondary | ICD-10-CM | POA: Diagnosis not present

## 2019-07-24 DIAGNOSIS — J449 Chronic obstructive pulmonary disease, unspecified: Secondary | ICD-10-CM | POA: Diagnosis not present

## 2019-07-24 DIAGNOSIS — R45851 Suicidal ideations: Secondary | ICD-10-CM | POA: Insufficient documentation

## 2019-07-24 DIAGNOSIS — Z20828 Contact with and (suspected) exposure to other viral communicable diseases: Secondary | ICD-10-CM | POA: Insufficient documentation

## 2019-07-24 DIAGNOSIS — Z046 Encounter for general psychiatric examination, requested by authority: Secondary | ICD-10-CM | POA: Diagnosis present

## 2019-07-24 LAB — SALICYLATE LEVEL: Salicylate Lvl: <7 mg/dL (ref 2.8–30.0)

## 2019-07-24 LAB — ETHANOL: Alcohol, Ethyl (B): <10 mg/dL (ref <10)

## 2019-07-24 LAB — CBC
HCT: 41.4 % (ref 36.0–46.0)
Hemoglobin: 13.3 g/dL (ref 12.0–15.0)
MCH: 34 pg (ref 26.0–34.0)
MCHC: 32.1 g/dL (ref 30.0–36.0)
MCV: 105.9 fL — ABNORMAL HIGH (ref 80.0–100.0)
Platelets: 303 10*3/uL (ref 150–400)
RBC: 3.91 MIL/uL (ref 3.87–5.11)
RDW: 15.4 % (ref 11.5–15.5)
WBC: 8.8 10*3/uL (ref 4.0–10.5)
nRBC: 0 % (ref 0.0–0.2)

## 2019-07-24 LAB — ACETAMINOPHEN LEVEL: Acetaminophen (Tylenol), Serum: <10 ug/mL — ABNORMAL LOW (ref 10–30)

## 2019-07-24 LAB — RAPID URINE DRUG SCREEN, HOSP PERFORMED
Amphetamines: NOT DETECTED
Barbiturates: NOT DETECTED
Benzodiazepines: NOT DETECTED
Cocaine: NOT DETECTED
Opiates: NOT DETECTED
Tetrahydrocannabinol: NOT DETECTED

## 2019-07-24 LAB — COMPREHENSIVE METABOLIC PANEL
ALT: 46 U/L — ABNORMAL HIGH (ref 0–44)
AST: 57 U/L — ABNORMAL HIGH (ref 15–41)
Albumin: 3.9 g/dL (ref 3.5–5.0)
Alkaline Phosphatase: 93 U/L (ref 38–126)
Anion gap: 12 (ref 5–15)
BUN: 25 mg/dL — ABNORMAL HIGH (ref 6–20)
CO2: 22 mmol/L (ref 22–32)
Calcium: 10.1 mg/dL (ref 8.9–10.3)
Chloride: 107 mmol/L (ref 98–111)
Creatinine, Ser: 0.82 mg/dL (ref 0.44–1.00)
GFR calc Af Amer: >60 mL/min (ref >60)
GFR calc non Af Amer: >60 mL/min (ref >60)
Glucose, Bld: 90 mg/dL (ref 70–99)
Potassium: 4.1 mmol/L (ref 3.5–5.1)
Sodium: 141 mmol/L (ref 135–145)
Total Bilirubin: 0.3 mg/dL (ref 0.3–1.2)
Total Protein: 7.5 g/dL (ref 6.5–8.1)

## 2019-07-24 LAB — SARS CORONAVIRUS 2 BY RT PCR (HOSPITAL ORDER, PERFORMED IN ~~LOC~~ HOSPITAL LAB): SARS Coronavirus 2: NEGATIVE

## 2019-07-24 MED ORDER — IBUPROFEN 200 MG PO TABS
400.0000 mg | ORAL_TABLET | Freq: Once | ORAL | Status: AC
  Administered 2019-07-24: 21:00:00 400 mg via ORAL
  Filled 2019-07-24: qty 2

## 2019-07-24 MED ORDER — DIVALPROEX SODIUM 500 MG PO DR TAB
500.0000 mg | DELAYED_RELEASE_TABLET | Freq: Two times a day (BID) | ORAL | Status: DC
  Administered 2019-07-24: 23:00:00 750 mg via ORAL
  Administered 2019-07-25: 09:00:00 500 mg via ORAL
  Filled 2019-07-24 (×2): qty 1

## 2019-07-24 MED ORDER — KETOROLAC TROMETHAMINE 30 MG/ML IJ SOLN
30.0000 mg | Freq: Once | INTRAMUSCULAR | Status: AC
  Administered 2019-07-24: 06:00:00 30 mg via INTRAMUSCULAR
  Filled 2019-07-24: qty 1

## 2019-07-24 MED ORDER — CYCLOBENZAPRINE HCL 10 MG PO TABS
10.0000 mg | ORAL_TABLET | Freq: Every evening | ORAL | Status: DC | PRN
  Administered 2019-07-24: 23:00:00 10 mg via ORAL
  Filled 2019-07-24: qty 1

## 2019-07-24 MED ORDER — LISINOPRIL 10 MG PO TABS
10.0000 mg | ORAL_TABLET | Freq: Every day | ORAL | Status: DC
  Administered 2019-07-25: 09:00:00 10 mg via ORAL
  Filled 2019-07-24: qty 1

## 2019-07-24 MED ORDER — MIRABEGRON ER 25 MG PO TB24
25.0000 mg | ORAL_TABLET | Freq: Every day | ORAL | Status: DC
  Administered 2019-07-25: 09:00:00 25 mg via ORAL
  Filled 2019-07-24: qty 1

## 2019-07-24 MED ORDER — QUETIAPINE FUMARATE ER 300 MG PO TB24
300.0000 mg | ORAL_TABLET | Freq: Two times a day (BID) | ORAL | Status: DC
  Administered 2019-07-24 – 2019-07-25 (×2): 300 mg via ORAL
  Filled 2019-07-24 (×3): qty 1

## 2019-07-24 MED ORDER — ACETAMINOPHEN 325 MG PO TABS: 650.0000 mg | ORAL_TABLET | Freq: Once | ORAL | Status: DC

## 2019-07-24 MED ORDER — MIRTAZAPINE 7.5 MG PO TABS
7.5000 mg | ORAL_TABLET | Freq: Every day | ORAL | Status: DC
  Administered 2019-07-24: 23:00:00 7.5 mg via ORAL
  Filled 2019-07-24: qty 1

## 2019-07-24 MED ORDER — ATORVASTATIN CALCIUM 40 MG PO TABS
40.0000 mg | ORAL_TABLET | Freq: Every day | ORAL | Status: DC
  Administered 2019-07-24: 23:00:00 40 mg via ORAL
  Filled 2019-07-24 (×2): qty 1

## 2019-07-24 MED ORDER — DULOXETINE HCL 30 MG PO CPEP
60.0000 mg | ORAL_CAPSULE | Freq: Every day | ORAL | Status: DC
  Administered 2019-07-25: 09:00:00 60 mg via ORAL
  Filled 2019-07-24: qty 2

## 2019-07-24 MED ORDER — LINACLOTIDE 145 MCG PO CAPS
290.0000 ug | ORAL_CAPSULE | Freq: Every day | ORAL | Status: DC
  Administered 2019-07-25: 08:00:00 290 ug via ORAL
  Filled 2019-07-24: qty 2

## 2019-07-24 MED ORDER — LORATADINE 10 MG PO TABS
10.0000 mg | ORAL_TABLET | Freq: Every day | ORAL | Status: DC
  Administered 2019-07-25: 09:00:00 10 mg via ORAL
  Filled 2019-07-24: qty 1

## 2019-07-24 MED ORDER — DIVALPROEX SODIUM 250 MG PO DR TAB
250.0000 mg | DELAYED_RELEASE_TABLET | Freq: Two times a day (BID) | ORAL | Status: DC
  Administered 2019-07-24 – 2019-07-25 (×2): 250 mg via ORAL
  Filled 2019-07-24 (×2): qty 1

## 2019-07-24 MED ORDER — AMLODIPINE BESYLATE 5 MG PO TABS
2.5000 mg | ORAL_TABLET | Freq: Every day | ORAL | Status: DC
  Administered 2019-07-25: 09:00:00 2.5 mg via ORAL
  Filled 2019-07-24: qty 1

## 2019-07-24 MED ORDER — ACETAMINOPHEN 325 MG PO TABS
650.0000 mg | ORAL_TABLET | Freq: Four times a day (QID) | ORAL | Status: DC | PRN
  Administered 2019-07-24: 17:00:00 650 mg via ORAL
  Filled 2019-07-24 (×2): qty 2

## 2019-07-24 MED ORDER — ALBUTEROL SULFATE HFA 108 (90 BASE) MCG/ACT IN AERS
1.0000 | INHALATION_SPRAY | Freq: Four times a day (QID) | RESPIRATORY_TRACT | Status: DC | PRN
  Administered 2019-07-24: 2 via RESPIRATORY_TRACT
  Filled 2019-07-24: qty 6.7

## 2019-07-24 NOTE — Progress Notes (Addendum)
This patient continues to meet inpatient criteria. CSW faxed information to the following facilities:   [facility]  [place] - No answer [facility] - No available beds [facility]  [facility]  [place] - No answer [facility]  - No available beds [facility] - Reviewing patient, [facility] assessment resent, per request [facility]   [facility] does not have appropriate bed availability at this time.  Stephanie Acre, [facility] Social Worker [phone]

## 2019-07-24 NOTE — ED Notes (Signed)
Patient when asked what has brought her to the hospital states, "for suicide and depression".

## 2019-07-24 NOTE — ED Notes (Signed)
Patient made aware that urine sample is needed.  
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
## 2019-07-24 NOTE — Progress Notes (Signed)
Patient states that she wears CPAP at home but there is no history that this RT can find. RT will continue to monitor

## 2019-07-24 NOTE — ED Notes (Signed)
She is sitting up in bed eating her breakfast. She is in no distress.

## 2019-07-24 NOTE — H&P (Signed)
Behavioral Health Medical Screening Exam  Shelly Anderson is an 48 y.o. female. Presents, escorted by [employer], with reported depression I.e. anhedonia, sadness, and hopelessness, along with SI/Plan. Patient gives hx of Bipolar d/o, and endorses current racing thoughts,.rumination, worrying, and insomnia. She is denying any AVH, paranoia or tactile hallucinations.She has a hx of chronic LBP and walks with assistance of can and back brace.  Total Time spent with patient: 15 minutes  Psychiatric Specialty Exam: Physical Exam  Constitutional: She is oriented to person, place, and time. She appears well-developed and well-nourished. No distress.  HENT:  Head: Normocephalic.  Eyes: Pupils are equal, round, and reactive to light.  Respiratory: Effort normal and breath sounds normal. No respiratory distress.  Neurological: She is alert and oriented to person, place, and time. No cranial nerve deficit.  Skin: Skin is warm and dry. She is not diaphoretic.  Psychiatric: Her speech is normal and behavior is normal. Judgment normal. Her mood appears anxious. Cognition and memory are impaired. She exhibits a depressed mood. She expresses suicidal ideation. She expresses no homicidal ideation. She expresses suicidal plans. She expresses no homicidal plans.    Review of Systems  Constitutional: Negative.  Negative for chills, diaphoresis, fever, malaise/fatigue and weight loss.  Psychiatric/Behavioral: Positive for depression and suicidal ideas. Negative for hallucinations, memory loss and substance abuse. The patient is nervous/anxious. The patient does not have insomnia.   All other systems reviewed and are negative.   There were no vitals taken for this visit.There is no height or weight on file to calculate BMI.  General Appearance: Casual  Eye Contact:  Good  Speech:  Pressured  Volume:  Increased  Mood:  Anxious and Depressed  Affect:  Congruent  Thought Process:  Goal Directed  Orientation:  Full  (Time, Place, and Person)  Thought Content:  Logical  Suicidal Thoughts:  Yes.  with intent/plan  Homicidal Thoughts:  No  Memory:  Immediate;   Fair  Judgement:  Fair  Insight:  Fair  Psychomotor Activity:  Normal  Concentration: Concentration: Fair  Recall:  [employer] of Knowledge:Fair  Language: Fair  Akathisia:  Negative  Handed:  Right  AIMS (if indicated):     Assets:  Desire for Improvement  Sleep:       Musculoskeletal: Strength & Muscle Tone: within normal limits Gait & Station: normal Patient leans: N/A  There were no vitals taken for this visit.  Recommendations:  Based on my evaluation the patient does not appear to have an emergency medical condition.  Laverle Hobby, PA-C 07/24/2019, 1:33 AM

## 2019-07-24 NOTE — Progress Notes (Signed)
CSW was informed that patient has been declined due to medical acuity.  TTS will continue to seek placement.   Chalmers Guest. Guerry Bruin, MSW, [place] Work/Disposition Phone: [phone] Fax: [phone]

## 2019-07-24 NOTE — ED Provider Notes (Signed)
[facility]-EMERGENCY DEPT Provider Note   CSN: [id number] Arrival date & time: 07/24/19  0247    History   Chief Complaint Chief Complaint  Patient presents with   Suicidal    HPI Shelly Anderson is a 48 y.o. female.     48 year old female with a history of COPD, diabetes, esophageal reflux, seizures presents to the emergency department for psychiatric evaluation.  She states that she has been feeling suicidal x2 days.  Reports plan to cut herself, but denies any self-harm since onset of SI.  She has been taking her daily prescribed medications, but states they are not working for her.  She last followed up with her psychiatry team approximately 1 month ago.  She denies any homicidal thoughts, alcohol use, illicit drug use.  Complains of feeling hungry.  The history is provided by the patient. No language interpreter was used.    Past Medical History:  Diagnosis Date   Anemia    COPD (chronic obstructive pulmonary disease) (HCC)    Diabetes mellitus without complication (HCC)    GERD (gastroesophageal reflux disease)    Neuropathy    Seizures (HCC)     Patient Active Problem List   Diagnosis Date Noted   Suicidal ideation 03/05/2018   Depression     Past Surgical History:  Procedure Laterality Date   KNEE SURGERY       OB History   No obstetric history on file.      Home Medications    Prior to Admission medications   Medication Sig Start Date End Date Taking? Authorizing Provider  acetaminophen (TYLENOL) 500 MG tablet Take 2 tablets (1,000 mg total) by mouth every 8 (eight) hours as needed. 03/21/19 03/20/20  Cuthriell, Delorise RoyalsJonathan D, PA-C  albuterol (PROVENTIL HFA;VENTOLIN HFA) 108 (90 Base) MCG/ACT inhaler Inhale 1-2 puffs into the lungs every 6 (six) hours as needed for wheezing or shortness of breath.    [provider]  atorvastatin (LIPITOR) 40 MG tablet Take 40 mg by mouth daily at 6 PM.     [provider]   calcium carbonate (TUMS - DOSED IN MG ELEMENTAL CALCIUM) 500 MG chewable tablet Chew 1 tablet by mouth 2 (two) times daily.     [provider]  clotrimazole (LOTRIMIN) 1 % cream Apply 1 application topically 2 (two) times daily.    [provider]  cyclobenzaprine (FLEXERIL) 10 MG tablet Take 10 mg by mouth at bedtime as needed for muscle spasms.    [provider]  divalproex (DEPAKOTE) 250 MG DR tablet Take 250 mg by mouth 2 (two) times daily.    [provider]  divalproex (DEPAKOTE) 500 MG DR tablet Take 500-1,000 mg by mouth 2 (two) times daily.    [provider]  DULoxetine (CYMBALTA) 60 MG capsule Take 60 mg by mouth daily.    [provider]  fenofibrate (TRICOR) 145 MG tablet Take 145 mg by mouth daily.    [provider]  fluticasone (FLONASE) 50 MCG/ACT nasal spray Place 1 spray into both nostrils daily.    [provider]  hydrOXYzine (VISTARIL) 25 MG capsule Take 25 mg by mouth at bedtime.    [provider]  LORazepam (ATIVAN) 0.5 MG tablet Take 0.5 mg by mouth 2 (two) times daily.     [provider]  Melatonin 3 MG TABS Take 3 mg by mouth daily.    [provider]  meloxicam (MOBIC) 15 MG tablet Take 15 mg  by mouth daily.    [provider]  mirtazapine (REMERON) 15 MG tablet Take 7.5 mg by mouth at bedtime.    [provider]  oxyCODONE-acetaminophen (PERCOCET) 5-325 MG tablet Take 1 tablet by mouth every 6 (six) hours as needed for severe pain. 12/05/18   Schuyler Amor, MD  polyethylene glycol (MIRALAX / GLYCOLAX) packet Take 17 g by mouth daily as needed for mild constipation.     [provider]  QUEtiapine (SEROQUEL XR) 300 MG 24 hr tablet Take 300 mg by mouth 2 (two) times daily.    [provider]  silver sulfADIAZINE (SILVADENE) 1 % cream Apply 1 application topically daily. 02/20/19   Fisher, Linden Dolin, PA-C  traMADol (ULTRAM) 50 MG tablet  Take 1 tablet (50 mg total) by mouth every 6 (six) hours as needed. 02/20/19   Fisher, Linden Dolin, PA-C  varenicline (CHANTIX) 1 MG tablet Take 1 mg by mouth daily.    [provider]    Family History History reviewed. No pertinent family history.  Social History Social History   Tobacco Use   Smoking status: Current Every Day Smoker    Packs/day: 0.50    Types: Cigarettes   Smokeless tobacco: Never Used  Substance Use Topics   Alcohol use: No   Drug use: Never     Allergies   Bee venom, Coconut oil, Eggs or egg-derived products, Penicillin g, Tetanus toxoids, and Phenytoin sodium extended   Review of Systems Review of Systems Ten systems reviewed and are negative for acute change, except as noted in the HPI.    Physical Exam Updated Vital Signs BP 133/79 (BP Location: Left Arm)    Pulse 76    Temp 98.2 F (36.8 C) (Oral)    Resp 18    Ht 5\' 2"  (1.575 m)    Wt 88.5 kg    SpO2 100%    BMI 35.67 kg/m   Physical Exam Vitals signs and nursing note reviewed.  Constitutional:      General: She is not in acute distress.    Appearance: She is well-developed. She is not diaphoretic.     Comments: Obese female in NAD  HENT:     Head: Normocephalic and atraumatic.  Eyes:     General: No scleral icterus.    Conjunctiva/sclera: Conjunctivae normal.  Neck:     Musculoskeletal: Normal range of motion.     Comments: Prior tracheostomy site Cardiovascular:     Rate and Rhythm: Normal rate and regular rhythm.     Pulses: Normal pulses.  Pulmonary:     Effort: Pulmonary effort is normal. No respiratory distress.     Comments: Respirations even and unlabored Musculoskeletal: Normal range of motion.  Skin:    General: Skin is warm and dry.     Coloration: Skin is not pale.     Findings: No erythema or rash.  Neurological:     Mental Status: She is alert and oriented to person, place, and time.  Psychiatric:        Speech: Speech normal.        Behavior: Behavior  is cooperative.        Thought Content: Thought content includes suicidal ideation.      ED Treatments / Results  Labs (all labs ordered are listed, but only abnormal results are displayed) Labs Reviewed  COMPREHENSIVE METABOLIC PANEL - Abnormal; Notable for the following components:      Result Value   BUN 25 (*)  AST 57 (*)    ALT 46 (*)    All other components within normal limits  ACETAMINOPHEN LEVEL - Abnormal; Notable for the following components:   Acetaminophen (Tylenol), Serum <10 (*)    All other components within normal limits  CBC - Abnormal; Notable for the following components:   MCV 105.9 (*)    All other components within normal limits  SARS CORONAVIRUS 2 (HOSPITAL ORDER, PERFORMED IN [facility])  ETHANOL  SALICYLATE LEVEL  RAPID URINE DRUG SCREEN, HOSP PERFORMED    EKG None  Radiology No results found.  Procedures Procedures (including critical care time)  Medications Ordered in ED Medications  ketorolac (TORADOL) 30 MG/ML injection 30 mg (30 mg Intramuscular Given 07/24/19 0601)     Initial Impression / Assessment and Plan / ED Course  I have reviewed the triage vital signs and the nursing notes.  Pertinent labs & imaging results that were available during my care of the patient were reviewed by me and considered in my medical decision making (see chart for details).        48 year old female presenting voluntarily for medical clearance.  Endorsing worsening depression and suicidal ideations.  [facility]  Labs reviewed and patient medically cleared.  Pending psychiatric placement at this time.  Disposition to be determined by oncoming ED provider.   Final Clinical Impressions(s) / ED Diagnoses   Final diagnoses:  Bipolar 1 disorder, depressed, severe [facility](HCC)    ED Discharge Orders    None       Antony MaduraHumes, Chay Mazzoni, PA-C 07/24/19 [id number]    Geoffery Lyonselo, [place], MD 07/24/19 [phone]

## 2019-07-24 NOTE — ED Triage Notes (Signed)
Pt presents with complaint of  suicidal ideation with plan on cutting herself. Pt states "see the cut on my arm" mild discoloration noted to left forearm and legs.

## 2019-07-24 NOTE — Consult Note (Addendum)
[facility] Face-to[facility]Face [facility] Consult   Reason for Consult:  Referring Physician:  Mr. Melvenia BeamSimon, [place] Patient Identification: Shelly Anderson MRN:  [id number] Principal Diagnosis: Bipolar I disorder, Current or most recent episode depressed, Severe Diagnosis:  Depression  Total Time spent with patient: 30 minutes  Subjective:  "My friend [name], she does not treat me very well." Shelly Anderson is a 48 y.o. female patient presented to [facility]  unaccompanied voluntarily by law enforcement. Thee patient endorses, that she does not like where she lives. Per TTS Mr. Davonna BellingWarrick,  the patient says she has a diagnosis of bipolar disorder and TBI. She reports, suffer from seizures disorder and takes Depakote to help prevent it.  She says she feels severely depressed and is currently suicidal with plan to cut her wrists. The patient was seen face-to-face by this provider; chart reviewed and consulted with Dr. Jama Flavorsobos on 07/24/2019 due to the care of the patient. It was discussed with the provider that the patient does meet criteria to be admitted to the inpatient unit. Due to her actively voicing suicidal ideations.  On evaluation the patient  is alert and oriented x 2-3, calm and cooperative, and mood-congruent with affect.  The patient does not appear to be responding to internal or external stimuli. Neither is the patient presenting with any delusional thinking. The patient denies auditory or visual hallucinations. The patient admits to suicidal and self-harm ideations. The patient is not presenting with any psychotic or paranoid behaviors. During an encounter with the patient, he/she was able to answer questions appropriately.     Plan: The patient is a safety risk to self and does require psychiatric inpatient admission for stabilization and treatment.  HPI:  Per Jobe GibbonHumes, PA-C 48 year old female with a history of COPD, diabetes, esophageal reflux, seizures presents to the emergency department for psychiatric  evaluation.  She states that she has been feeling suicidal x2 days.  Reports plan to cut herself, but denies any self-harm since onset of SI.  She has been taking her daily prescribed medications, but states they are not working for her.  She last followed up with her psychiatry team approximately 1 month ago.  She denies any homicidal thoughts, alcohol use, illicit drug use.  Complains of feeling hungry.  Past Psychiatric History:    Risk to Self:  Yes Risk to Others:  No Prior Inpatient Therapy:  Yes Prior Outpatient Therapy:  Yes  Past Medical History:  Past Medical History:  Diagnosis Date  . Anemia   . COPD (chronic obstructive pulmonary disease) (HCC)   . Diabetes mellitus without complication (HCC)   . GERD (gastroesophageal reflux disease)   . Neuropathy   . Seizures (HCC)     Past Surgical History:  Procedure Laterality Date  . KNEE SURGERY     Family History: History reviewed. No pertinent family history. Family Psychiatric  History:  Social History:  Social History   Substance and Sexual Activity  Alcohol Use No     Social History   Substance and Sexual Activity  Drug Use Never    Social History   Socioeconomic History  . Marital status: Divorced    Spouse name: Not on file  . Number of children: Not on file  . Years of education: Not on file  . Highest education level: Not on file  Occupational History  . Not on file  Social Needs  . Financial resource strain: Not on file  . Food insecurity    Worry: Not on  file    Inability: Not on file  . Transportation needs    Medical: Not on file    Non-medical: Not on file  Tobacco Use  . Smoking status: Current Every Day Smoker    Packs/day: 0.50    Types: Cigarettes  . Smokeless tobacco: Never Used  Substance and Sexual Activity  . Alcohol use: No  . Drug use: Never  . Sexual activity: Not on file  Lifestyle  . Physical activity    Days per week: Not on file    Minutes per session: Not on file  .  Stress: Not on file  Relationships  . Social Musician on phone: Not on file    Gets together: Not on file    Attends religious service: Not on file    Active member of club or organization: Not on file    Attends meetings of clubs or organizations: Not on file    Relationship status: Not on file  Other Topics Concern  . Not on file  Social History Narrative  . Not on file   Additional Social History:    Allergies:   Allergies  Allergen Reactions  . Bee Venom Other (See Comments)    Throat swelling  . Coconut Oil Anaphylaxis  . Eggs Or Egg-Derived Products Rash    Throat swelling   . Penicillin G Rash    Has patient had a PCN reaction causing immediate rash, facial/tongue/throat swelling, SOB or lightheadedness with hypotension: Yes Has patient had a PCN reaction causing severe rash involving mucus membranes or skin necrosis: No Has patient had a PCN reaction that required hospitalization: Yes Has patient had a PCN reaction occurring within the last 10 years: Yes If all of the above answers are "NO", then may proceed with Cephalosporin use.   . Tetanus Toxoids Swelling    Pt cannot have due to egg allergy  . Phenytoin Sodium Extended     Other reaction(s): Unknown    Labs:  Results for orders placed or performed during the hospital encounter of 07/24/19 (from the past 48 hour(s))  Rapid urine drug screen (hospital performed)     Status: None   Collection Time: 07/24/19  3:15 AM  Result Value Ref Range   Opiates NONE DETECTED NONE DETECTED   Cocaine NONE DETECTED NONE DETECTED   Benzodiazepines NONE DETECTED NONE DETECTED   Amphetamines NONE DETECTED NONE DETECTED   Tetrahydrocannabinol NONE DETECTED NONE DETECTED   Barbiturates NONE DETECTED NONE DETECTED    Comment: (NOTE) DRUG SCREEN FOR MEDICAL PURPOSES ONLY.  IF CONFIRMATION IS NEEDED FOR ANY PURPOSE, NOTIFY LAB WITHIN 5 DAYS. LOWEST DETECTABLE LIMITS FOR URINE DRUG SCREEN Drug Class                      Cutoff (ng/mL) Amphetamine and metabolites    1000 Barbiturate and metabolites    200 Benzodiazepine                 200 Tricyclics and metabolites     300 Opiates and metabolites        300 Cocaine and metabolites        300 THC                            50 Performed at [facility], [address] [place], [place] [address]   Comprehensive metabolic panel     Status: Abnormal  Collection Time: 07/24/19  4:53 AM  Result Value Ref Range   Sodium 141 135 - 145 mmol/L   Potassium 4.1 3.5 - 5.1 mmol/L   Chloride 107 98 - 111 mmol/L   CO2 22 22 - 32 mmol/L   Glucose, Bld 90 70 - 99 mg/dL   BUN 25 (H) 6 - 20 mg/dL   Creatinine, Ser 0.980.82 0.44 - 1.00 mg/dL   Calcium 11.910.1 8.9 - 14.710.3 mg/dL   Total Protein 7.5 6.5 - 8.1 g/dL   Albumin 3.9 3.5 - 5.0 g/dL   AST 57 (H) 15 - 41 U/L   ALT 46 (H) 0 - 44 U/L   Alkaline Phosphatase 93 38 - 126 U/L   Total Bilirubin 0.3 0.3 - 1.2 mg/dL   GFR calc non Af Amer >60 >60 mL/min   GFR calc Af Amer >60 >60 mL/min   Anion gap 12 5 - 15    Comment: Performed at [facility], [address] [place], [place] [address]  cbc     Status: Abnormal   Collection Time: 07/24/19  4:53 AM  Result Value Ref Range   WBC 8.8 4.0 - 10.5 K/uL   RBC 3.91 3.87 - 5.11 MIL/uL   Hemoglobin 13.3 12.0 - 15.0 g/dL   HCT 21.341.4 08.636.0 - 57.846.0 %   MCV 105.9 (H) 80.0 - 100.0 fL   MCH 34.0 26.0 - 34.0 pg   MCHC 32.1 30.0 - 36.0 g/dL   RDW 46.915.4 62.911.5 - 52.815.5 %   Platelets 303 150 - 400 K/uL   nRBC 0.0 0.0 - 0.2 %    Comment: Performed at [facility], [address] [place], [place] [address]  Ethanol     Status: None   Collection Time: 07/24/19  4:54 AM  Result Value Ref Range   Alcohol, Ethyl (B) <10 <10 mg/dL    Comment: (NOTE) Lowest detectable limit for serum alcohol is 10 mg/dL. For medical purposes only. Performed at [facility], [address] [place], [place] [address]    Salicylate level     Status: None   Collection Time: 07/24/19  4:54 AM  Result Value Ref Range   Salicylate Lvl <7.0 2.8 - 30.0 mg/dL    Comment: Performed at [facility], [address] [place], [place] [address]  Acetaminophen level     Status: Abnormal   Collection Time: 07/24/19  4:54 AM  Result Value Ref Range   Acetaminophen (Tylenol), Serum <10 (L) 10 - 30 ug/mL    Comment: (NOTE) Therapeutic concentrations vary significantly. A range of 10-30 ug/mL  may be an effective concentration for many patients. However, some  are best treated at concentrations outside of this range. Acetaminophen concentrations >150 ug/mL at 4 hours after ingestion  and >50 ug/mL at 12 hours after ingestion are often associated with  toxic reactions. Performed at [facility], [address] [place], [place] [address]   SARS Coronavirus 2 [facility](Hospital order, Performed in [facility] hospital lab) Nasopharyngeal Nasopharyngeal Swab     Status: None   Collection Time: 07/24/19  5:13 AM   Specimen: Nasopharyngeal Swab  Result Value Ref Range   SARS Coronavirus 2 NEGATIVE NEGATIVE    Comment: (NOTE) If result is NEGATIVE SARS-CoV-2 target nucleic acids are NOT DETECTED. The SARS-CoV-2 RNA is generally detectable in upper and lower  respiratory specimens during the acute phase of infection. The lowest  concentration of SARS-CoV-2 viral copies  this assay can detect is 250  copies / mL. A negative result does not preclude SARS-CoV-2 infection  and should not be used as the sole basis for treatment or other  patient management decisions.  A negative result may occur with  improper specimen collection / handling, submission of specimen other  than nasopharyngeal swab, presence of viral mutation(s) within the  areas targeted by this assay, and inadequate number of viral copies  (<250 copies / mL). A negative result must be combined with clinical  observations, patient  history, and epidemiological information. If result is POSITIVE SARS-CoV-2 target nucleic acids are DETECTED. The SARS-CoV-2 RNA is generally detectable in upper and lower  respiratory specimens dur ing the acute phase of infection.  Positive  results are indicative of active infection with SARS-CoV-2.  Clinical  correlation with patient history and other diagnostic information is  necessary to determine patient infection status.  Positive results do  not rule out bacterial infection or co-infection with other viruses. If result is PRESUMPTIVE POSTIVE SARS-CoV-2 nucleic acids MAY BE PRESENT.   A presumptive positive result was obtained on the submitted specimen  and confirmed on repeat testing.  While 2019 novel coronavirus  (SARS-CoV-2) nucleic acids may be present in the submitted sample  additional confirmatory testing may be necessary for epidemiological  and / or clinical management purposes  to differentiate between  SARS-CoV-2 and other Sarbecovirus currently known to infect humans.  If clinically indicated additional testing with an alternate test  methodology [id number]) is advised. The SARS-CoV-2 RNA is generally  detectable in upper and lower respiratory sp ecimens during the acute  phase of infection. The expected result is Negative. Fact Sheet for Patients:  StrictlyIdeas.no Fact Sheet for Healthcare Providers: BankingDealers.co.za This test is not yet approved or cleared by the [place] FDA and has been authorized for detection and/or diagnosis of SARS-CoV-2 by FDA under an Emergency Use Authorization (EUA).  This EUA will remain in effect (meaning this test can be used) for the duration of the COVID-19 declaration under Section 564(b)(1) of the Act, 21 U.S.C. section 360bbb-3(b)(1), unless the authorization is terminated or revoked sooner. Performed at [facility], [place] [address] [place],  [place] [address]     Current Facility-Administered Medications  Medication Dose Route Frequency Provider Last Rate Last Dose  . acetaminophen (TYLENOL) tablet 650 mg  650 mg Oral Q6H PRN Julianne Rice, MD   650 mg at 07/24/19 1726   Current Outpatient Medications  Medication Sig Dispense Refill  . acetaminophen (TYLENOL) 500 MG tablet Take 2 tablets (1,000 mg total) by mouth every 8 (eight) hours as needed. 50 tablet 0  . albuterol (PROVENTIL HFA;VENTOLIN HFA) 108 (90 Base) MCG/ACT inhaler Inhale 1-2 puffs into the lungs every 6 (six) hours as needed for wheezing or shortness of breath.    Marland Kitchen amLODipine (NORVASC) 2.5 MG tablet Take 2.5 mg by mouth daily.     Marland Kitchen atorvastatin (LIPITOR) 40 MG tablet Take 40 mg by mouth at bedtime.     . cholecalciferol (VITAMIN D3) 25 MCG (1000 UT) tablet Take 1,000 Units by mouth daily.    . cyclobenzaprine (FLEXERIL) 10 MG tablet Take 10 mg by mouth at bedtime as needed for muscle spasms.    . divalproex (DEPAKOTE) 250 MG DR tablet Take 250 mg by mouth 2 (two) times daily.    . divalproex (DEPAKOTE) 500 MG DR tablet Take 500 mg by mouth 2 (two) times daily.     Marland Kitchen  DULoxetine (CYMBALTA) 60 MG capsule Take 60 mg by mouth daily.    . fenofibrate (TRICOR) 145 MG tablet Take 145 mg by mouth daily.    . ferrous sulfate 325 (65 FE) MG tablet Take 325 mg by mouth 2 (two) times daily with a meal.    . fluticasone (FLONASE) 50 MCG/ACT nasal spray Place 1 spray into both nostrils daily.    . hydrocortisone 2.5 % cream Apply 1 application topically 2 (two) times daily.    . hydrOXYzine (VISTARIL) 25 MG capsule Take 25 mg by mouth every evening.     Marland Kitchen ketotifen (ZADITOR) 0.025 % ophthalmic solution Place 1 drop into both eyes 2 (two) times daily.    Marland Kitchen LINZESS 290 MCG CAPS capsule Take 290 mcg by mouth daily before breakfast.     . lisinopril (ZESTRIL) 10 MG tablet Take 10 mg by mouth daily.     Marland Kitchen loratadine (CLARITIN) 10 MG tablet Take 10 mg by mouth daily.    Marland Kitchen LORazepam  (ATIVAN) 0.5 MG tablet Take 0.5 mg by mouth 2 (two) times daily.     . Melatonin 3 MG TABS Take 6 mg by mouth at bedtime.     . meloxicam (MOBIC) 15 MG tablet Take 15 mg by mouth daily.    . mirtazapine (REMERON) 7.5 MG tablet Take 7.5 mg by mouth at bedtime.     Marland Kitchen MYRBETRIQ 25 MG TB24 tablet Take 25 mg by mouth daily.     . polyethylene glycol (MIRALAX / GLYCOLAX) packet Take 17 g by mouth daily as needed for mild constipation.     . QUEtiapine (SEROQUEL XR) 300 MG 24 hr tablet Take 300 mg by mouth 2 (two) times daily.    . rizatriptan (MAXALT) 10 MG tablet Take 10 mg by mouth as needed for migraine. May repeat in 2 hours if needed    . SEREVENT DISKUS 50 MCG/DOSE diskus inhaler Inhale 2 puffs into the lungs daily.     . varenicline (CHANTIX) 1 MG tablet Take 1 mg by mouth daily.    Marland Kitchen oxyCODONE-acetaminophen (PERCOCET) 5-325 MG tablet Take 1 tablet by mouth every 6 (six) hours as needed for severe pain. (Patient not taking: Reported on 07/24/2019) 12 tablet 0  . silver sulfADIAZINE (SILVADENE) 1 % cream Apply 1 application topically daily. (Patient not taking: Reported on 07/24/2019) 50 g 0  . traMADol (ULTRAM) 50 MG tablet Take 1 tablet (50 mg total) by mouth every 6 (six) hours as needed. (Patient not taking: Reported on 07/24/2019) 15 tablet 0    Musculoskeletal: Strength & Muscle Tone: within normal limits Gait & Station: normal Patient leans: N/A  Psychiatric Specialty Exam: Physical Exam  Nursing note and vitals reviewed. Constitutional: She is oriented to person, place, and time. She appears well-developed and well-nourished.  HENT:  Head: Normocephalic.  Eyes: Pupils are equal, round, and reactive to light. Conjunctivae are normal.  Neck: Normal range of motion. Neck supple.  Cardiovascular: Normal rate.  Respiratory: Effort normal.  Musculoskeletal: Normal range of motion.  Neurological: She is alert and oriented to person, place, and time.  Skin: Skin is warm and dry.    Review  of Systems  Psychiatric/Behavioral: Positive for depression and suicidal ideas. The patient is nervous/anxious.   All other systems reviewed and are negative.   Blood pressure 118/69, pulse 85, temperature 99 F (37.2 C), temperature source Oral, resp. rate 16, height 5\' 2"  (1.575 m), weight 88.5 kg, SpO2 96 %.Body mass index is  35.67 kg/m.  General Appearance: Fairly Groomed  Eye Contact:  Good  Speech:  Clear and Coherent  Volume:  Normal  Mood:  Anxious, Depressed and Hopeless  Affect:  Blunt, Congruent, Depressed and Flat  Thought Process:  Coherent and Goal Directed  Orientation:  Full (Time, Place, and Person)  Thought Content:  Logical  Suicidal Thoughts:  Yes.  without intent/plan  Homicidal Thoughts:  No  Memory:  Immediate;   Fair Recent;   Fair  Judgement:  Fair  Insight:  Fair  Psychomotor Activity:  Decreased  Concentration:  Concentration: Fair and Attention Span: Fair  Recall:  [employer]Fair  Fund of Knowledge:  Fair  Language:  Fair  Akathisia:  Negative  Handed:  Right  AIMS (if indicated):     Assets:  Communication Skills Desire for Improvement Social Support  ADL's:  Intact  Cognition:  WNL  Sleep:   Good     Treatment Plan Summary: Daily contact with patient to assess and evaluate symptoms and progress in treatment and Medication management  Disposition: Recommend psychiatric Inpatient admission when medically cleared. Supportive therapy provided about ongoing stressors. The patient is a safety risk to self and does require psychiatric inpatient admission for stabilization and treatment. Gillermo MurdochJacqueline Thompson, NP 07/24/2019 6:29 PM   Attest to NP  note

## 2019-07-24 NOTE — ED Notes (Signed)
Her daughter Dorris Fetch phones at this time to tell us that pt. Has a hx of threatening suicide and threatening others' safety (threats of violence). She then comes to hospital and tells the psychologist that she is "better" and released. Her daughter also tells [place] pt. Had been "off her meds for longer than three days and that she did some drinking last night".

## 2019-07-24 NOTE — Progress Notes (Signed)
Report on Situation, Background, Assessment, and Recommendations received from [place], Therapist, sports. Patient transferred to TCU. Patient alert and in no visible distress. Patient made aware of 1:1 observations and security cameras for their safety. Patient instructed to come to me with needs or concerns. Pt. Given orientation of the unit and room.

## 2019-07-25 ENCOUNTER — Encounter (HOSPITAL_COMMUNITY): Payer: Self-pay | Admitting: Registered Nurse

## 2019-07-25 ENCOUNTER — Other Ambulatory Visit: Payer: Self-pay

## 2019-07-25 DIAGNOSIS — F314 Bipolar disorder, current episode depressed, severe, without psychotic features: Secondary | ICD-10-CM | POA: Diagnosis not present

## 2019-07-25 NOTE — BH Assessment (Signed)
[facility] Assessment Progress Note  Per Patriciaann Clan, PA, this pt requires psychiatric hospitalization at this time.  Pt presents under IVC initiated by pt's daughter, however, a First Examination was not completed within 24 hours of pt's arrival at [facility], and IVC has expired.  Per Buford Dresser, DO, pt no longer meets criteria for IVC.  [facility] staff calls this morning to report that pt has been accepted to their facility by Dr Jonelle Sports to the [facility].  I later spoke to [place] at [facility], notifying them that pt is no longer under IVC.  She reports that they will still accept pt if she agrees to transfer.  I then spoke to pt, and she agrees to transfer as a voluntary patient.  Shuvon Rankin, FNP, concurs with this disposition.  Pt's nurse, Diane, has been notified, and agrees to call report to [phone].  Pt is to be transported via Stacey Drain, [place] Coordinator [phone]

## 2019-07-25 NOTE — ED Notes (Signed)
Called report to [facility] and called [facility] transportation

## 2019-07-25 NOTE — ED Notes (Signed)
Pt transported to [facility] by [employer].  Two hospital bags with pt's name on the bags returned to her. She used her cane to walk with. Her teeth were in her mouth. She does not have a CPAP. She said that she does not have one at home.  She used RT's CPAP.

## 2019-07-25 NOTE — Discharge Summary (Addendum)
  Patient to be transferred to [facility] for inpatient psychiatric treatment  Shuvon B. Rankin, NP   Patient's chart reviewed. Reviewed the information documented and agree with the treatment plan.  Buford Dresser, DO 07/25/19 5:50 PM

## 2019-08-15 ENCOUNTER — Emergency Department (HOSPITAL_COMMUNITY)
Admission: EM | Admit: 2019-08-15 | Discharge: 2019-08-16 | Disposition: A | Discharge status: Home / Self Care | Payer: Medicare HMO | Attending: Emergency Medicine | Admitting: Emergency Medicine

## 2019-08-15 ENCOUNTER — Encounter (HOSPITAL_COMMUNITY): Payer: Self-pay | Admitting: Emergency Medicine

## 2019-08-15 ENCOUNTER — Ambulatory Visit (HOSPITAL_COMMUNITY)
Admission: RE | Admit: 2019-08-15 | Discharge: 2019-08-15 | Disposition: A | Discharge status: Home / Self Care | Payer: Medicare HMO | Source: Home / Self Care | Attending: Psychiatry | Admitting: Psychiatry

## 2019-08-15 ENCOUNTER — Other Ambulatory Visit: Payer: Self-pay

## 2019-08-15 DIAGNOSIS — Y929 Unspecified place or not applicable: Secondary | ICD-10-CM | POA: Insufficient documentation

## 2019-08-15 DIAGNOSIS — S31809A Unspecified open wound of unspecified buttock, initial encounter: Secondary | ICD-10-CM | POA: Insufficient documentation

## 2019-08-15 DIAGNOSIS — F17210 Nicotine dependence, cigarettes, uncomplicated: Secondary | ICD-10-CM | POA: Insufficient documentation

## 2019-08-15 DIAGNOSIS — F419 Anxiety disorder, unspecified: Secondary | ICD-10-CM

## 2019-08-15 DIAGNOSIS — Y939 Activity, unspecified: Secondary | ICD-10-CM | POA: Diagnosis not present

## 2019-08-15 DIAGNOSIS — Z79899 Other long term (current) drug therapy: Secondary | ICD-10-CM | POA: Insufficient documentation

## 2019-08-15 DIAGNOSIS — Y999 Unspecified external cause status: Secondary | ICD-10-CM | POA: Insufficient documentation

## 2019-08-15 DIAGNOSIS — G4700 Insomnia, unspecified: Secondary | ICD-10-CM | POA: Diagnosis not present

## 2019-08-15 DIAGNOSIS — E119 Type 2 diabetes mellitus without complications: Secondary | ICD-10-CM | POA: Insufficient documentation

## 2019-08-15 DIAGNOSIS — X58XXXA Exposure to other specified factors, initial encounter: Secondary | ICD-10-CM | POA: Diagnosis not present

## 2019-08-15 DIAGNOSIS — J449 Chronic obstructive pulmonary disease, unspecified: Secondary | ICD-10-CM | POA: Insufficient documentation

## 2019-08-15 LAB — COMPREHENSIVE METABOLIC PANEL
ALT: 27 U/L (ref 0–44)
AST: 31 U/L (ref 15–41)
Albumin: 3.7 g/dL (ref 3.5–5.0)
Alkaline Phosphatase: 84 U/L (ref 38–126)
Anion gap: 10 (ref 5–15)
BUN: 14 mg/dL (ref 6–20)
CO2: 24 mmol/L (ref 22–32)
Calcium: 9.5 mg/dL (ref 8.9–10.3)
Chloride: 105 mmol/L (ref 98–111)
Creatinine, Ser: 1.11 mg/dL — ABNORMAL HIGH (ref 0.44–1.00)
GFR calc Af Amer: >60 mL/min (ref >60)
GFR calc non Af Amer: 60 mL/min — ABNORMAL LOW (ref >60)
Glucose, Bld: 123 mg/dL — ABNORMAL HIGH (ref 70–99)
Potassium: 4.4 mmol/L (ref 3.5–5.1)
Sodium: 139 mmol/L (ref 135–145)
Total Bilirubin: 0.6 mg/dL (ref 0.3–1.2)
Total Protein: 6.8 g/dL (ref 6.5–8.1)

## 2019-08-15 LAB — RAPID URINE DRUG SCREEN, HOSP PERFORMED
Amphetamines: NOT DETECTED
Barbiturates: NOT DETECTED
Benzodiazepines: POSITIVE — AB
Cocaine: NOT DETECTED
Opiates: NOT DETECTED
Tetrahydrocannabinol: NOT DETECTED

## 2019-08-15 LAB — CBC
HCT: 38.9 % (ref 36.0–46.0)
Hemoglobin: 12.3 g/dL (ref 12.0–15.0)
MCH: 33.5 pg (ref 26.0–34.0)
MCHC: 31.6 g/dL (ref 30.0–36.0)
MCV: 106 fL — ABNORMAL HIGH (ref 80.0–100.0)
Platelets: 274 10*3/uL (ref 150–400)
RBC: 3.67 MIL/uL — ABNORMAL LOW (ref 3.87–5.11)
RDW: 14.1 % (ref 11.5–15.5)
WBC: 9 10*3/uL (ref 4.0–10.5)
nRBC: 0 % (ref 0.0–0.2)

## 2019-08-15 LAB — ETHANOL: Alcohol, Ethyl (B): <10 mg/dL (ref <10)

## 2019-08-15 NOTE — ED Notes (Signed)
Pt to the nursing station multiple times with multiple request to see doctor and have her medicines. This RN explained that she was waiting to see the doctor and he/she would have to place orders for medications. Pt stating that she is very agitated and wanting to be seen now. This RN had pt return to hallway recliner to wait to be seen.

## 2019-08-15 NOTE — ED Triage Notes (Signed)
Patient requesting psychiatric treatment for her persistent anxiety/insomnia , denies suicidal or homicidal ideation .

## 2019-08-15 NOTE — ED Notes (Signed)
[place] pts daughter

## 2019-08-16 DIAGNOSIS — S31809A Unspecified open wound of unspecified buttock, initial encounter: Secondary | ICD-10-CM | POA: Diagnosis not present

## 2019-08-16 MED ORDER — IBUPROFEN 400 MG PO TABS
400.0000 mg | ORAL_TABLET | Freq: Once | ORAL | Status: AC
  Administered 2019-08-16: 400 mg via ORAL
  Filled 2019-08-16: qty 1

## 2019-08-16 MED ORDER — CLOTRIMAZOLE 1 % EX CREA: TOPICAL_CREAM | CUTANEOUS | 0 refills | Status: DC

## 2019-08-16 NOTE — ED Notes (Signed)
Pt did not respond to vitals update

## 2019-08-16 NOTE — Discharge Instructions (Signed)
It is important to maintain good hygiene. Clean your skin daily with soap and water and keep your skin dry. You can apply the cream as prescribed to your area of discomfort at your bottom. Follow up with your primary care provider regarding your anxiety.

## 2019-08-16 NOTE — ED Provider Notes (Signed)
[facility] EMERGENCY DEPARTMENT Provider Note   CSN: [id number] Arrival date & time: 08/15/19  2102     History   Chief Complaint Chief Complaint  Patient presents with  . Anxiety  . Insomnia    HPI Shelly Anderson is a 48 y.o. female with past medical history of GERD, diabetes, COPD, presenting to the emergency department with complaint of pain to her bottom which she attributes to hemorrhoids.  She states in the past she has used Preparation H which did not help, however a hydrocortisone cream did.  She is unable to tell me when this began.  She has some burning pain associated. Per documentation, on initial presentation to the ED patient was complaining of anxiety and insomnia, however did not mention any of this until asked.  She stated "oh yeah I have had that too."  She has not followed up with her PCP or psychiatrist for this.  She states she had no need to because she is leaving for [place] state this evening to move there.  She denies SI or HI adamantly.     The history is provided by the patient.    Past Medical History:  Diagnosis Date  . Anemia   . COPD (chronic obstructive pulmonary disease) ([place])   . Diabetes mellitus without complication ([place])   . GERD (gastroesophageal reflux disease)   . Neuropathy   . Seizures [facility])     Patient Active Problem List   Diagnosis Date Noted  . Suicidal ideation 03/05/2018  . Depression     Past Surgical History:  Procedure Laterality Date  . KNEE SURGERY       OB History   No obstetric history on file.      Home Medications    Prior to Admission medications   Medication Sig Start Date End Date Taking? Authorizing Provider  albuterol (PROVENTIL HFA;VENTOLIN HFA) 108 (90 Base) MCG/ACT inhaler Inhale 1-2 puffs into the lungs every 6 (six) hours as needed for wheezing or shortness of breath.    [provider]  amLODipine (NORVASC) 2.5 MG tablet Take 2.5 mg by mouth daily.  06/10/19    [provider]  atorvastatin (LIPITOR) 40 MG tablet Take 40 mg by mouth at bedtime.     [provider]  cholecalciferol (VITAMIN D3) 25 MCG (1000 UT) tablet Take 1,000 Units by mouth daily.    [provider]  clotrimazole (LOTRIMIN) 1 % cream Apply to affected area 2 times daily 08/16/19   Keyna Blizard, [place] N, PA-C  cyclobenzaprine (FLEXERIL) 10 MG tablet Take 10 mg by mouth at bedtime as needed for muscle spasms.    [provider]  divalproex (DEPAKOTE) 250 MG DR tablet Take 250 mg by mouth 2 (two) times daily.    [provider]  divalproex (DEPAKOTE) 500 MG DR tablet Take 500 mg by mouth 2 (two) times daily.     [provider]  DULoxetine (CYMBALTA) 60 MG capsule Take 60 mg by mouth daily.    [provider]  fenofibrate (TRICOR) 145 MG tablet Take 145 mg by mouth daily.    [provider]  ferrous sulfate 325 (65 FE) MG tablet Take 325 mg by mouth 2 (two) times daily with a meal.    [provider]  fluticasone (FLONASE) 50 MCG/ACT nasal spray Place 1 spray into both nostrils daily.    [provider]  hydrocortisone 2.5 % cream Apply 1 application topically 2 (two) times daily.  [provider]  hydrOXYzine (VISTARIL) 25 MG capsule Take 25 mg by mouth every evening.     [provider]  ketotifen (ZADITOR) 0.025 % ophthalmic solution Place 1 drop into both eyes 2 (two) times daily.    [provider]  LINZESS 290 MCG CAPS capsule Take 290 mcg by mouth daily before breakfast.  05/24/19   [provider]  lisinopril (ZESTRIL) 10 MG tablet Take 10 mg by mouth daily.  05/24/19   [provider]  loratadine (CLARITIN) 10 MG tablet Take 10 mg by mouth daily.    [provider]  Melatonin 3 MG TABS Take 6 mg by mouth at bedtime.     [provider]  meloxicam (MOBIC) 15 MG tablet Take 15 mg by mouth daily.    [provider]  mirtazapine  (REMERON) 7.5 MG tablet Take 7.5 mg by mouth at bedtime.  07/21/19   [provider]  MYRBETRIQ 25 MG TB24 tablet Take 25 mg by mouth daily.  05/24/19   [provider]  polyethylene glycol (MIRALAX / GLYCOLAX) packet Take 17 g by mouth daily as needed for mild constipation.     [provider]  QUEtiapine (SEROQUEL XR) 300 MG 24 hr tablet Take 300 mg by mouth 2 (two) times daily.    [provider]  rizatriptan (MAXALT) 10 MG tablet Take 10 mg by mouth as needed for migraine. May repeat in 2 hours if needed    [provider]  SEREVENT DISKUS 50 MCG/DOSE diskus inhaler Inhale 2 puffs into the lungs daily.  05/24/19   [provider]  varenicline (CHANTIX) 1 MG tablet Take 1 mg by mouth daily.    [provider]    Family History No family history on file.  Social History Social History   Tobacco Use  . Smoking status: Current Every Day Smoker    Packs/day: 0.50    Types: Cigarettes  . Smokeless tobacco: Never Used  Substance Use Topics  . Alcohol use: No  . Drug use: Never     Allergies   Bee venom, Coconut oil, Eggs or egg-derived products, Penicillin g, Tetanus toxoids, and Phenytoin sodium extended   Review of Systems Review of Systems  All other systems reviewed and are negative.    Physical Exam Updated Vital Signs BP 131/76   Pulse 77   Temp 98.3 F (36.8 C) (Oral)   Resp 14   Ht 5\' 2"  (1.575 m) Comment: Stated  Wt 90.7 kg Comment: Stated  SpO2 98%   BMI 36.58 kg/m   Physical Exam Vitals signs and nursing note reviewed.  Constitutional:      General: She is not in acute distress.    Appearance: She is well-developed.  HENT:     Head: Normocephalic and atraumatic.  Eyes:     Conjunctiva/sclera: Conjunctivae normal.  Cardiovascular:     Rate and Rhythm: Normal rate.  Pulmonary:     Effort: Pulmonary effort is normal.  Abdominal:     Palpations: Abdomen is soft.  Genitourinary:    Comments:  Exam performed with female RN chaperone present.  Upon examining the rectum, there are no external hemorrhoids visualized, no tenderness.  Patient then stating her pain is not to her rectum it is higher up in her gluteal cleft.  Upon examination there is linear cracking of the skin along the cleft. The skin appears moist with some mild erythema, no fluctuance.  Skin:    General: Skin  is warm.  Neurological:     Mental Status: She is alert.  Psychiatric:        Mood and Affect: Mood normal.        Behavior: Behavior normal. Behavior is cooperative.        Thought Content: Thought content does not include homicidal or suicidal ideation.      ED Treatments / Results  Labs (all labs ordered are listed, but only abnormal results are displayed) Labs Reviewed  COMPREHENSIVE METABOLIC PANEL - Abnormal; Notable for the following components:      Result Value   Glucose, Bld 123 (*)    Creatinine, Ser 1.11 (*)    GFR calc non Af Amer 60 (*)    All other components within normal limits  CBC - Abnormal; Notable for the following components:   RBC 3.67 (*)    MCV 106.0 (*)    All other components within normal limits  RAPID URINE DRUG SCREEN, HOSP PERFORMED - Abnormal; Notable for the following components:   Benzodiazepines POSITIVE (*)    All other components within normal limits  ETHANOL    EKG None  Radiology No results found.  Procedures Procedures (including critical care time)  Medications Ordered in ED Medications  ibuprofen (ADVIL) tablet 400 mg (400 mg Oral Given 08/16/19 0751)     Initial Impression / Assessment and Plan / ED Course  I have reviewed the triage vital signs and the nursing notes.  Pertinent labs & imaging results that were available during my care of the patient were reviewed by me and considered in my medical decision making (see chart for details).       Patient presenting with unclear complaints though mainly complaining of pain to her gluteal  cleft.  On exam the skin is moist with some cracking to the skin along the cleft.  She only is complaining of anxiety when asked though does not elaborate.  She adamantly denies SI or HI.  She is resting comfortably in no distress.  Screening labs obtained in triage are unremarkable.  Believe patient is safe for discharge at this time with instructions to follow-up with her PCP.  She will be prescribed topical antifungal given location of rash/wound and the moistness of the skin. Pt without complaints prior to discharge.  Discussed results, findings, treatment and follow up. Patient advised of return precautions. Patient verbalized understanding and agreed with plan.   Final Clinical Impressions(s) / ED Diagnoses   Final diagnoses:  Wound of gluteal cleft, unspecified laterality, initial encounter  Anxiety    ED Discharge Orders         Ordered    clotrimazole (LOTRIMIN) 1 % cream     08/16/19 0732           Brittannie Tawney, [place] N, PA-C 08/16/19 [phone]    Pricilla LovelessGoldston, Scott, MD 08/16/19 [phone]

## 2019-08-16 NOTE — ED Notes (Signed)
Pt given all her belongings. Gave pt discharge instructions. Pt understands them and denies any HI/SI.

## 2019-08-16 NOTE — ED Notes (Signed)
Pt's dtr Nonnie Done will pick pt up from the waiting room. Gave pt [place] sandwich and diet coke

## 2019-12-27 IMAGING — DX DG SHOULDER 2+V*R*
2 series · 2 of 2 positions shown · non-contrast
Comparison: 01/22/2018

CLINICAL DATA: Right shoulder postreduction

EXAM:
RIGHT SHOULDER - 2+ VIEW

[shoulder ap]
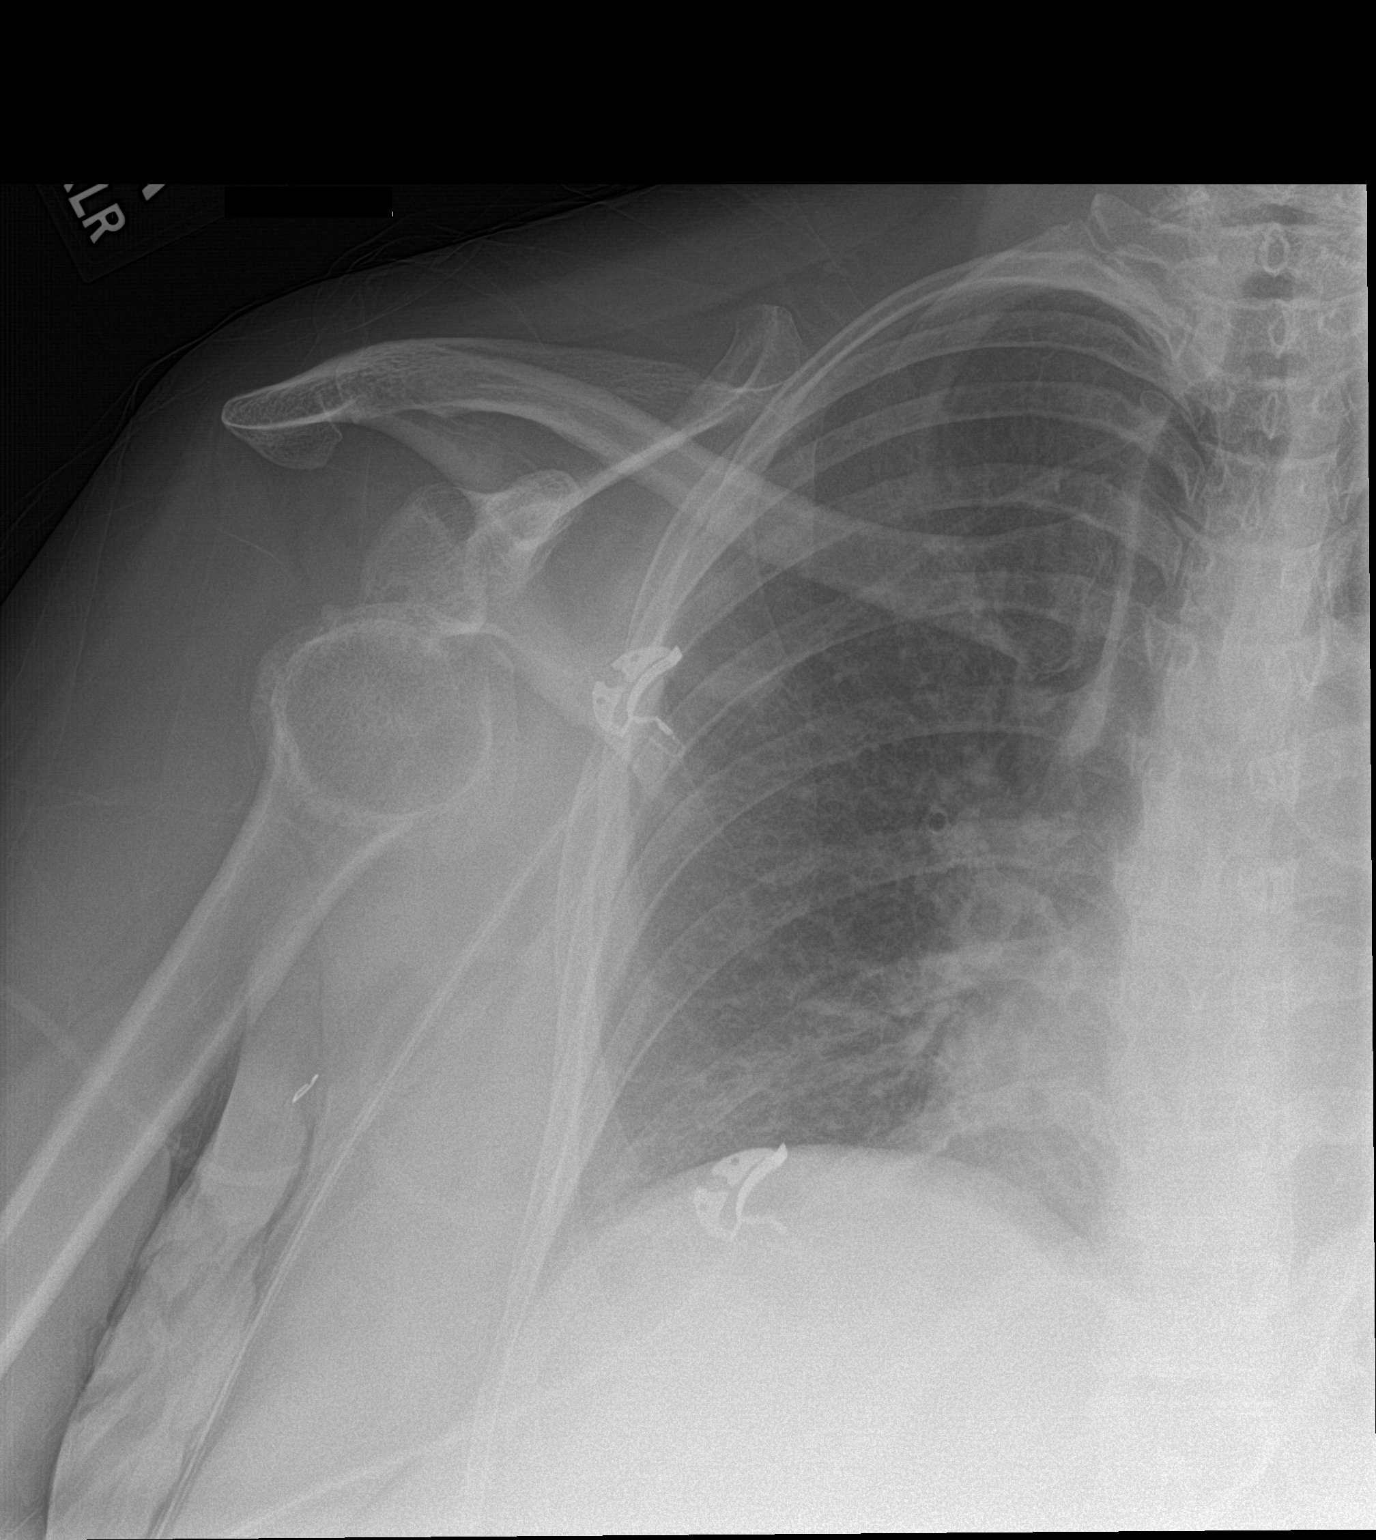

[shoulder obl]
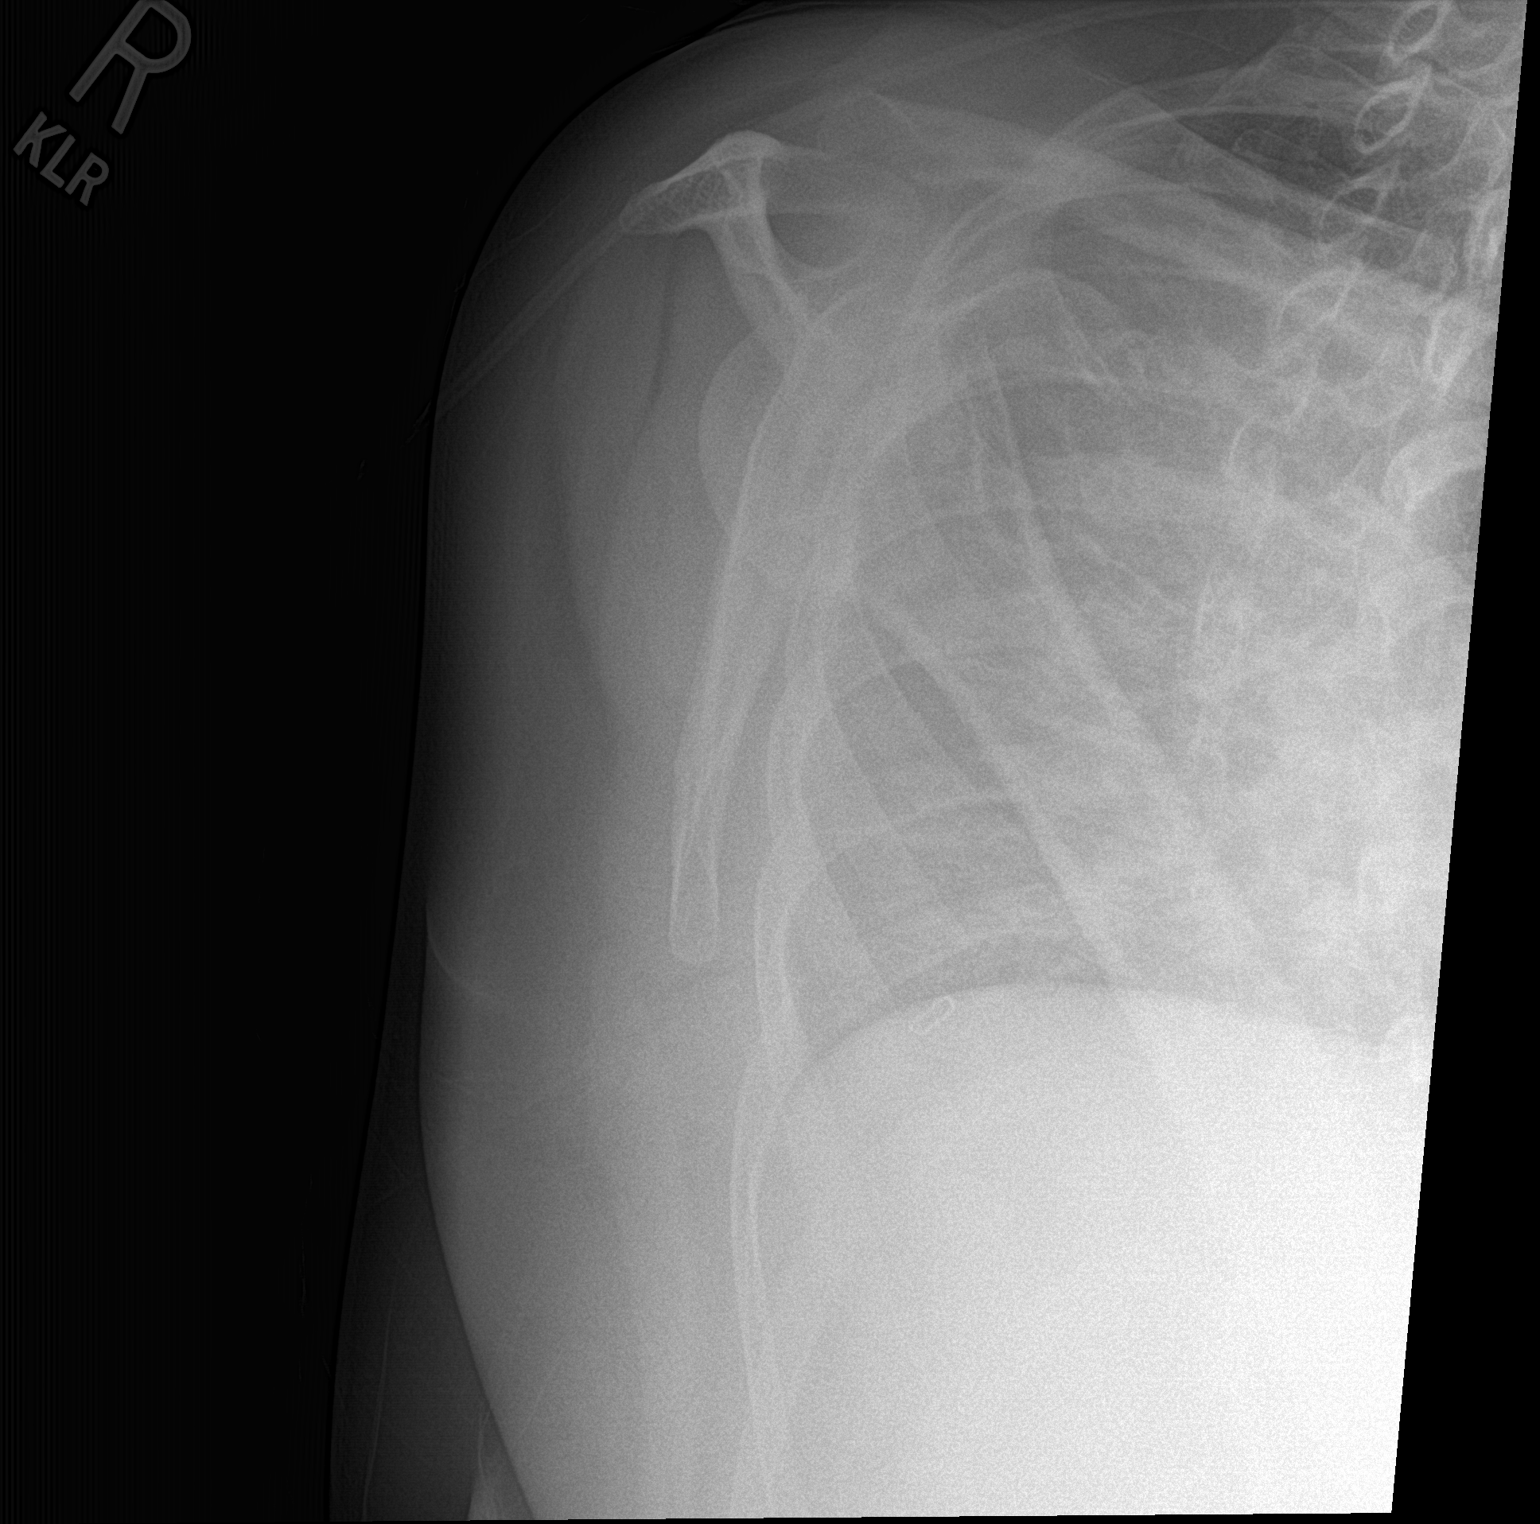

[2 of 2 positions shown; findings below may reference images not displayed]

FINDINGS: Continued anterior inferior displacement of the humeral head
compatible with dislocation. No change. Advanced degenerative
spurring about the humeral head.
IMPRESSION: Continued anterior/inferior dislocation.

## 2019-12-27 IMAGING — CR DG SHOULDER 2+V*R*
1 series · 2 of 2 positions shown · non-contrast
Comparison: None.

CLINICAL DATA: Possible shoulder dislocation.

EXAM:
RIGHT SHOULDER - 2+ VIEW

[Series 1: dg shoulder right · 0.14mm/px · 2 of 2 slices shown]
[im 1/2]
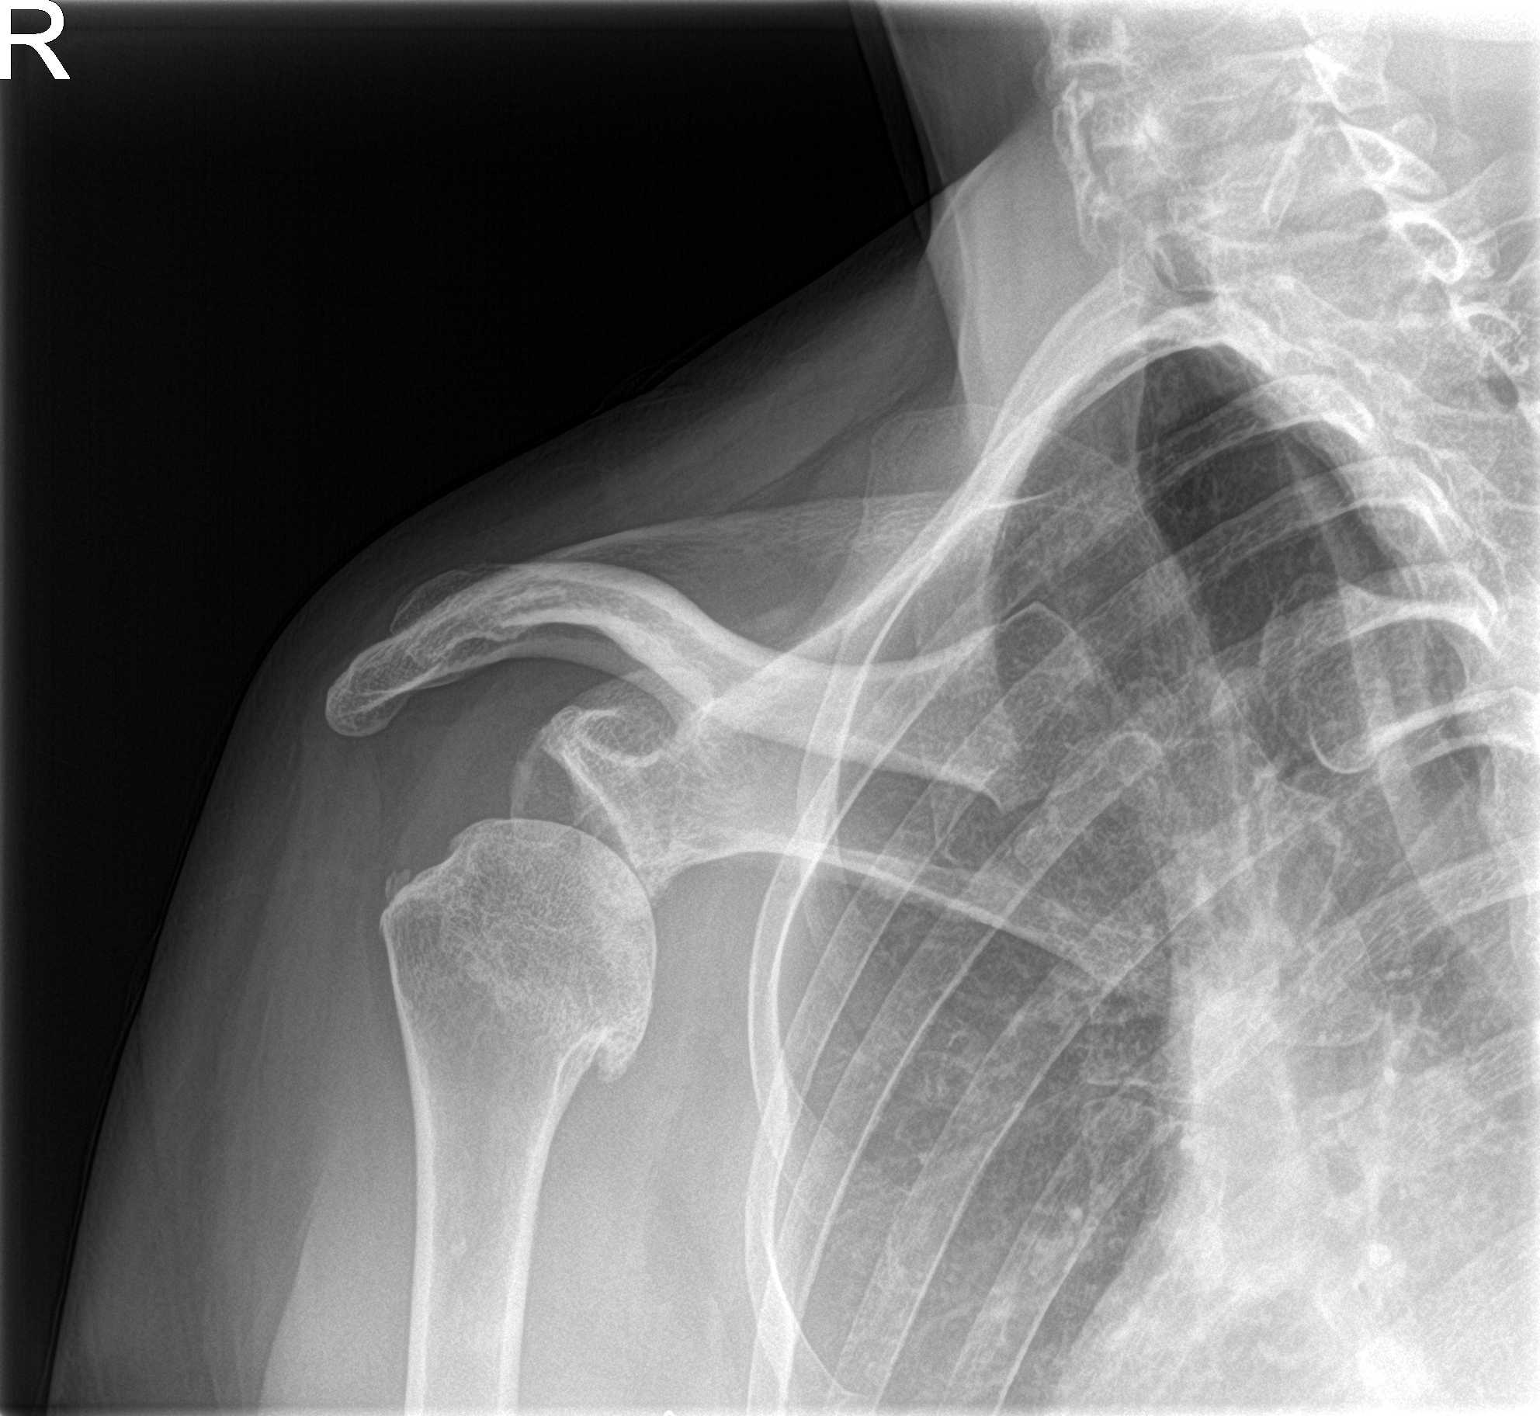
[im 2/2]
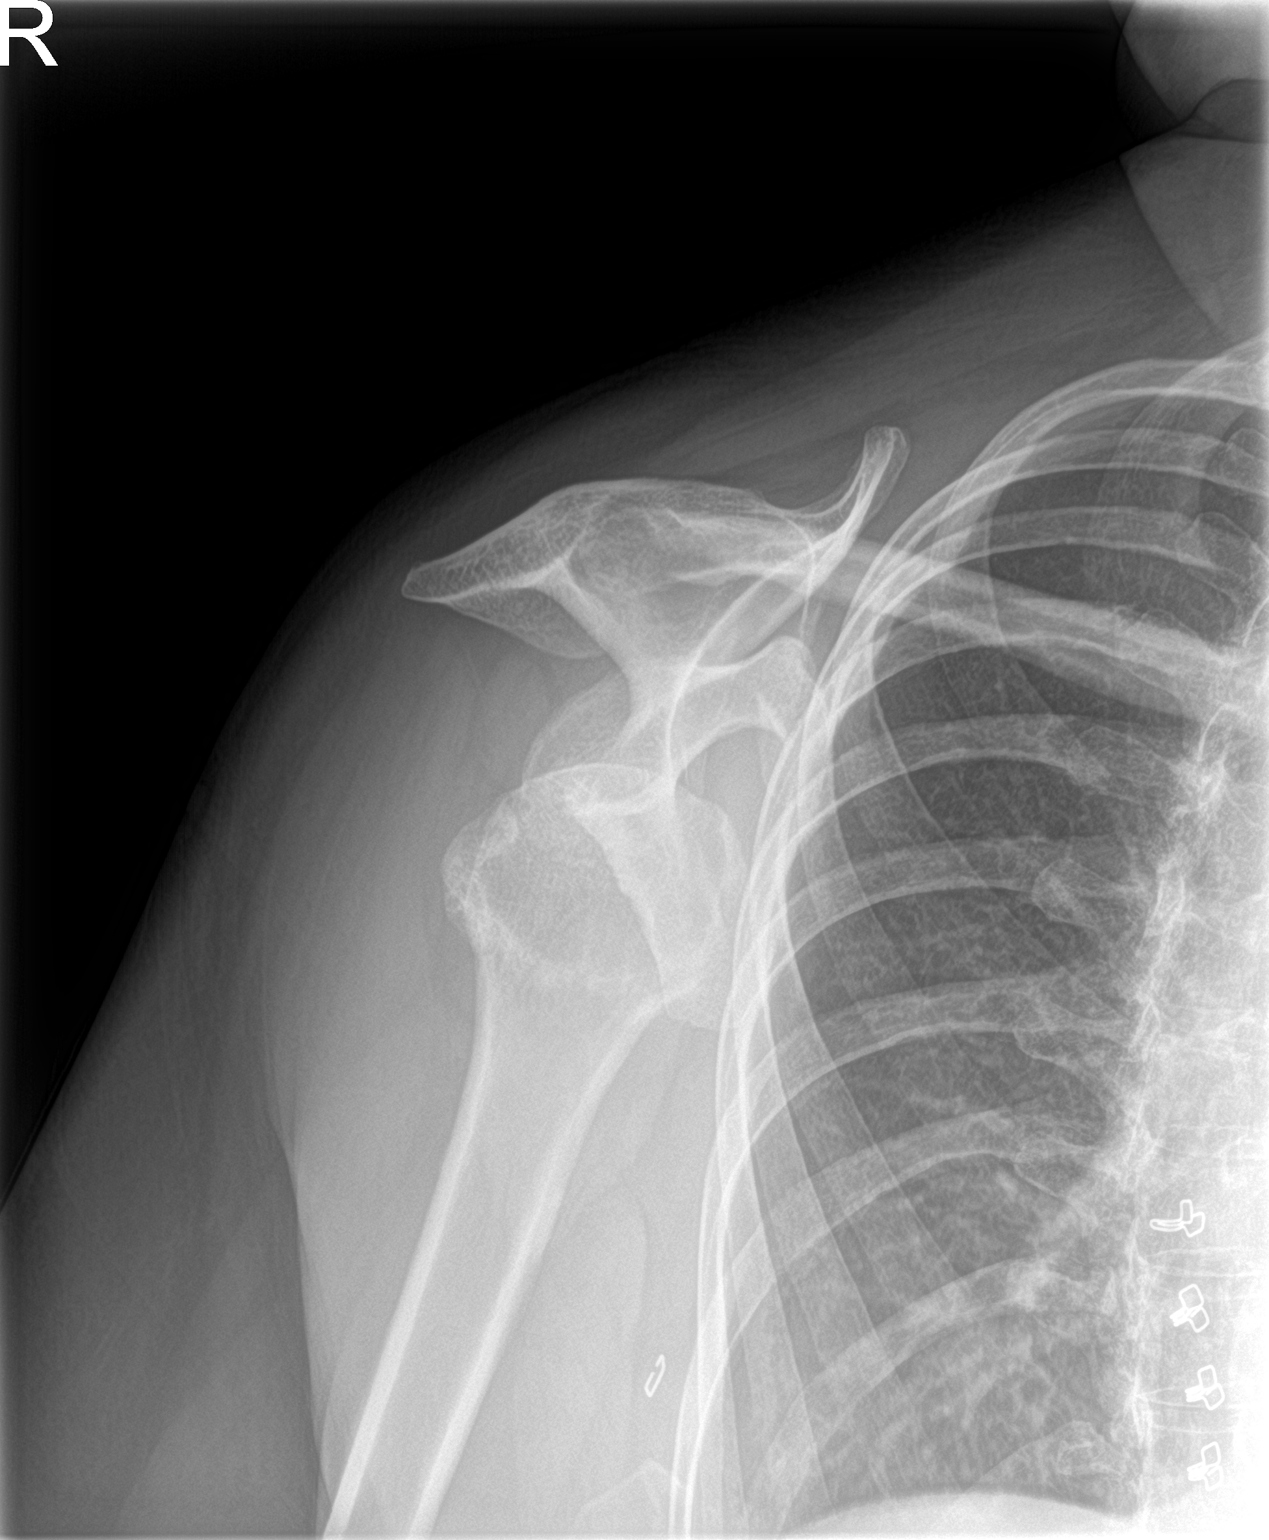

[2 of 2 positions shown; findings below may reference images not displayed]

FINDINGS: Loss of joint space noted in the glenohumeral joint with increased
acromial humeral distance on today's study. Humeral head is
displaced inferiorly on the AP view suggesting subluxation, but
appears dislocated on the scapular Y-view.. Acromioclavicular and
coracoclavicular distances appear preserved.
IMPRESSION: Two views study shows inferior displacement of the humeral head
which is either markedly subluxed or dislocated.

## 2020-04-28 ENCOUNTER — Emergency Department
Admission: EM | Admit: 2020-04-28 | Discharge: 2020-04-29 | Disposition: A | Discharge status: Home / Self Care | Payer: Medicare HMO | Attending: Emergency Medicine | Admitting: Emergency Medicine

## 2020-04-28 ENCOUNTER — Other Ambulatory Visit: Payer: Self-pay

## 2020-04-28 ENCOUNTER — Emergency Department: Payer: Medicare HMO

## 2020-04-28 DIAGNOSIS — Z20822 Contact with and (suspected) exposure to covid-19: Secondary | ICD-10-CM | POA: Diagnosis not present

## 2020-04-28 DIAGNOSIS — F17210 Nicotine dependence, cigarettes, uncomplicated: Secondary | ICD-10-CM | POA: Diagnosis not present

## 2020-04-28 DIAGNOSIS — Z590 Homelessness: Secondary | ICD-10-CM | POA: Diagnosis not present

## 2020-04-28 DIAGNOSIS — Z79899 Other long term (current) drug therapy: Secondary | ICD-10-CM | POA: Diagnosis not present

## 2020-04-28 DIAGNOSIS — Z008 Encounter for other general examination: Secondary | ICD-10-CM

## 2020-04-28 DIAGNOSIS — E119 Type 2 diabetes mellitus without complications: Secondary | ICD-10-CM | POA: Diagnosis not present

## 2020-04-28 DIAGNOSIS — M25562 Pain in left knee: Secondary | ICD-10-CM | POA: Diagnosis not present

## 2020-04-28 DIAGNOSIS — M25511 Pain in right shoulder: Secondary | ICD-10-CM | POA: Diagnosis not present

## 2020-04-28 DIAGNOSIS — W19XXXA Unspecified fall, initial encounter: Secondary | ICD-10-CM

## 2020-04-28 DIAGNOSIS — J449 Chronic obstructive pulmonary disease, unspecified: Secondary | ICD-10-CM | POA: Diagnosis not present

## 2020-04-28 DIAGNOSIS — W010XXA Fall on same level from slipping, tripping and stumbling without subsequent striking against object, initial encounter: Secondary | ICD-10-CM | POA: Diagnosis not present

## 2020-04-28 DIAGNOSIS — F329 Major depressive disorder, single episode, unspecified: Secondary | ICD-10-CM | POA: Diagnosis not present

## 2020-04-28 MED ORDER — TRAMADOL HCL 50 MG PO TABS
100.0000 mg | ORAL_TABLET | Freq: Once | ORAL | Status: AC
  Administered 2020-04-28: 100 mg via ORAL
  Filled 2020-04-28: qty 2

## 2020-04-28 NOTE — ED Triage Notes (Signed)
Pt states is here because she needs her psychiatric medications refilled. Pt does not know names of medications of doseage of meds. Pt states she also fell today injuring her left knee. Pt denies SI or HI.
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
## 2020-04-28 NOTE — Discharge Instructions (Addendum)
Patient was seen by the psychiatric team and cleared for discharge home

## 2020-04-28 NOTE — ED Notes (Signed)
Pt reports she is here because she fell and she is off her psych meds. Pt does not know her meds. Pt denies SI/HI

## 2020-04-28 NOTE — ED Notes (Signed)
Pt to Xray via wheelchair with xray tech

## 2020-04-29 ENCOUNTER — Telehealth: Payer: Self-pay

## 2020-04-29 ENCOUNTER — Other Ambulatory Visit: Payer: Self-pay

## 2020-04-29 ENCOUNTER — Telehealth: Payer: Self-pay | Admitting: Emergency Medicine

## 2020-04-29 ENCOUNTER — Emergency Department
Admission: EM | Admit: 2020-04-29 | Discharge: 2020-04-30 | Disposition: A | Discharge status: Home / Self Care | Payer: Medicare HMO | Source: Home / Self Care | Attending: Emergency Medicine | Admitting: Emergency Medicine

## 2020-04-29 ENCOUNTER — Encounter: Payer: Self-pay | Admitting: Emergency Medicine

## 2020-04-29 DIAGNOSIS — Z79899 Other long term (current) drug therapy: Secondary | ICD-10-CM | POA: Insufficient documentation

## 2020-04-29 DIAGNOSIS — Z008 Encounter for other general examination: Secondary | ICD-10-CM

## 2020-04-29 DIAGNOSIS — Z7984 Long term (current) use of oral hypoglycemic drugs: Secondary | ICD-10-CM | POA: Insufficient documentation

## 2020-04-29 DIAGNOSIS — J449 Chronic obstructive pulmonary disease, unspecified: Secondary | ICD-10-CM | POA: Insufficient documentation

## 2020-04-29 DIAGNOSIS — R462 Strange and inexplicable behavior: Secondary | ICD-10-CM | POA: Insufficient documentation

## 2020-04-29 DIAGNOSIS — Z5900 Homelessness unspecified: Secondary | ICD-10-CM

## 2020-04-29 DIAGNOSIS — E119 Type 2 diabetes mellitus without complications: Secondary | ICD-10-CM | POA: Insufficient documentation

## 2020-04-29 DIAGNOSIS — Z20822 Contact with and (suspected) exposure to covid-19: Secondary | ICD-10-CM | POA: Insufficient documentation

## 2020-04-29 DIAGNOSIS — F17210 Nicotine dependence, cigarettes, uncomplicated: Secondary | ICD-10-CM | POA: Insufficient documentation

## 2020-04-29 HISTORY — DX: Unspecified intracranial injury with loss of consciousness status unknown, initial encounter: S06.9XAA

## 2020-04-29 HISTORY — DX: Unspecified intracranial injury with loss of consciousness of unspecified duration, initial encounter: S06.9X9A

## 2020-04-29 LAB — URINE DRUG SCREEN, QUALITATIVE (ARMC ONLY)
Amphetamines, Ur Screen: NOT DETECTED
Barbiturates, Ur Screen: POSITIVE — AB
Benzodiazepine, Ur Scrn: NOT DETECTED
Cannabinoid 50 Ng, Ur ~~LOC~~: POSITIVE — AB
Cocaine Metabolite,Ur ~~LOC~~: NOT DETECTED
MDMA (Ecstasy)Ur Screen: POSITIVE — AB
Methadone Scn, Ur: NOT DETECTED
Opiate, Ur Screen: NOT DETECTED
Phencyclidine (PCP) Ur S: NOT DETECTED
Tricyclic, Ur Screen: POSITIVE — AB

## 2020-04-29 LAB — URINALYSIS, COMPLETE (UACMP) WITH MICROSCOPIC
Bacteria, UA: NONE SEEN
Bilirubin Urine: NEGATIVE
Glucose, UA: NEGATIVE mg/dL
Hgb urine dipstick: NEGATIVE
Ketones, ur: NEGATIVE mg/dL
Leukocytes,Ua: NEGATIVE
Nitrite: NEGATIVE
Protein, ur: NEGATIVE mg/dL
Specific Gravity, Urine: 1.017 (ref 1.005–1.030)
pH: 5 (ref 5.0–8.0)

## 2020-04-29 LAB — PREGNANCY, URINE: Preg Test, Ur: NEGATIVE

## 2020-04-29 MED ORDER — OXYCODONE-ACETAMINOPHEN 5-325 MG PO TABS: 1.0000 | ORAL_TABLET | Freq: Four times a day (QID) | ORAL | Status: DC | PRN

## 2020-04-29 MED ORDER — TRAMADOL HCL 50 MG PO TABS
50.0000 mg | ORAL_TABLET | Freq: Three times a day (TID) | ORAL | Status: DC | PRN
  Administered 2020-04-29 – 2020-04-30 (×2): 50 mg via ORAL
  Filled 2020-04-29 (×2): qty 1

## 2020-04-29 MED ORDER — OXYCODONE-ACETAMINOPHEN 5-325 MG PO TABS
1.0000 | ORAL_TABLET | Freq: Four times a day (QID) | ORAL | Status: DC | PRN
  Administered 2020-04-29: 1 via ORAL
  Filled 2020-04-29: qty 1

## 2020-04-29 MED ORDER — DOCUSATE SODIUM 100 MG PO CAPS
100.0000 mg | ORAL_CAPSULE | Freq: Two times a day (BID) | ORAL | Status: DC | PRN
  Administered 2020-04-29: 21:00:00 100 mg via ORAL
  Filled 2020-04-29: qty 1

## 2020-04-29 NOTE — ED Provider Notes (Signed)
Emergency Medicine Observation Re-evaluation Note  Shelly Anderson is a 49 y.o. female, seen on rounds today.  Pt initially presented to the ED for complaints of Medication Refill and Fall Currently, the patient is resting.  Physical Exam  BP (!) 118/96   Pulse 92   Temp 99 F (37.2 C) (Oral)   Resp 16   Ht 5\' 2"  (1.575 m)   Wt 86.2 kg   SpO2 100%   BMI 34.75 kg/m  ED Course / MDM  I have reviewed the labs performed to date as well as medications administered while in observation.  Recent changes in the last 24 hours include - none. Plan  Current plan is for psych eval. Patient is under full IVC at this time.   ., MD 04/29/20 [phone]

## 2020-04-29 NOTE — Telephone Encounter (Signed)
This RN spoke with Shelly Anderson, Case Manager, per Case Manager, pt could be given resources to go to homeless shelter tomorrow, however closed today. Pt came to desk to speak with this RN. Pt upset not given prescription for Tramadol and c/o pain. This RN explained to patient patient could not be given medication due to patient having been discharged. Pt states she is upset that she was not given follow up prescription and she is in pain. Pt then became upset and tearful and walked out of ED.

## 2020-04-29 NOTE — Telephone Encounter (Signed)
This RN spoke with Shelly Anderson, Case Manager [phone], per Shelly Anderson, will speak with SW at [facility] due to that being her physical location and call this RN back. This RN explained that through chart review patient also had a hx of TBI. Awaiting return call from [facility], Case Manager.

## 2020-04-29 NOTE — ED Notes (Addendum)
Hourly rounding reveals patient awake in room. No complaints, stable, in no acute distress. Q15 minute rounds and monitoring via [place] and Officer to continue.  

## 2020-04-29 NOTE — ED Notes (Signed)
This RN spoke with patient's daughter Eli Phillips and updated regarding situation.
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
## 2020-04-29 NOTE — ED Notes (Signed)
Called and spoke with pts daughter, Eli Phillips, Pt dtr states that pt has hx/o substance abuse, pt previously abused percocet and hydrocodone. Pt dtr is very concerned about pt getting narcotic pain medication. Pt dtr states that pt takes tramadol and tylenol at home and this helps.
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
## 2020-04-29 NOTE — ED Notes (Addendum)
Pt dressed out into hospital provided scrubs with this tech and [place], NT in the rm. Pt belongings consist of black shoes, pink/white pajama pants, a black jacket, two black hair bows, a bracelet with white and yellow colored beads with a cross charm, black socks, white shirt, a backpack with a black wallet, ten one dollar bills, a brown journal, a yellow journal and a cell phone. Pt calm and cooperative while dressing out. Pt belongings placed into two pt belongings bags and labeled with pt name.    Pt has a black walking cane with pink flowers that will stay with her.

## 2020-04-29 NOTE — ED Notes (Signed)
Pt ambulated to bathroom without her cane.  New brief provided to pt.  Ambulated back to room, lights off, watching TV

## 2020-04-29 NOTE — ED Notes (Signed)
Hourly rounding reveals patient sleeping in room. No complaints, stable, in no acute distress. Q15 minute rounds and monitoring via Rover and Officer to continue.  
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
## 2020-04-29 NOTE — ED Notes (Signed)
Spoke with Dr. Fuller Plan regarding pt daughter's concern of hx/o substance abuse and pt receiving narcotic pain medications. Dr. Fuller Plan agreeable with holding pain medication and having pharmacy do a med rec and not giving pt narcotic medication.

## 2020-04-29 NOTE — ED Notes (Signed)
Pt dressed out by Shanda Bumps, [employer] and this RN belongings placed in bag and include pair of black tennis shoes with orthopedic inserts, black footies, plaid pajama pants, black fleece pullover shirt, long sleeve T=shirt with "life is better with coffee" on it. bractlet of white colored stones and yellow colored beds and a yellow colored cross. Black android cell phone with several cracks on the screen. Multi colored back pack with personal items in it. All items placed in two bags and labeled and placed in the locker room in [place].

## 2020-04-29 NOTE — ED Notes (Signed)
Pt up and to the bathroom. Returned to her room and asked for pain medication for her shoulder pain. Dr Colon Branch informed.

## 2020-04-29 NOTE — ED Notes (Signed)
Spoke with Richard from pharmacy and requested that he do a med rec on patient

## 2020-04-29 NOTE — Telephone Encounter (Signed)
This RN spoke with psych NP who states patient is psychiatrically cleared, recommended this RN call social worker. This RN called Child psychotherapist and spoke with Young Berry [phone], per Young Berry, if patient is psychiatrically cleared and does not have a legal guardian then patient is deemed competent. Per Young Berry, recommendations to speak with psych regarding having patient re-evaluated for safety, or contacting Case manager.    Pt gave this RN permission to contact Lucinda, listed in chart as caregiver.

## 2020-04-29 NOTE — ED Provider Notes (Signed)
[facility] Emergency Department Provider Note  Time seen: 5:39 PM  I have reviewed the triage vital signs and the nursing notes.   HISTORY  Chief Complaint Homeless and Medication Refill   HPI Shelly Anderson is a 49 y.o. female with a past medical history of anemia, COPD, gastric reflux, TBI, presents to the emergency department for essentially homelessness.  Patient was seen in the emergency department by myself yesterday, psychiatry was involved and cleared the patient psychiatrically.  Patient was discharged however her daughter refuses to pick the patient up.  States she does not feel safe for the patient living at her home.  Patient has nowhere else to go and so she stayed in the emergency department and finally checked back in.  Patient is complaining of knee pain and shoulder pain which she states has been a chronic issue takes tramadol at home.  These were imaged yesterday with no acute significant findings.   Past Medical History:  Diagnosis Date  . Anemia   . COPD (chronic obstructive pulmonary disease) (HCC)   . Diabetes mellitus without complication (HCC)   . GERD (gastroesophageal reflux disease)   . Neuropathy   . Seizures (HCC)   . Traumatic brain injury [facility])     Patient Active Problem List   Diagnosis Date Noted  . Bizarre behavior 04/29/2020  . Encounter for psychological evaluation 04/29/2020  . Suicidal ideation 03/05/2018  . Depression     Past Surgical History:  Procedure Laterality Date  . KNEE SURGERY      Prior to Admission medications   Medication Sig Start Date End Date Taking? Authorizing Provider  albuterol (PROVENTIL HFA;VENTOLIN HFA) 108 (90 Base) MCG/ACT inhaler Inhale 1-2 puffs into the lungs every 6 (six) hours as needed for wheezing or shortness of breath.    [provider]  amLODipine (NORVASC) 2.5 MG tablet Take 2.5 mg by mouth daily.  06/10/19   [provider]  atorvastatin (LIPITOR) 40 MG  tablet Take 40 mg by mouth at bedtime.     [provider]  cholecalciferol (VITAMIN D3) 25 MCG (1000 UT) tablet Take 1,000 Units by mouth daily.    [provider]  cyclobenzaprine (FLEXERIL) 10 MG tablet Take 10 mg by mouth at bedtime as needed for muscle spasms.    [provider]  divalproex (DEPAKOTE) 250 MG DR tablet Take 250 mg by mouth 2 (two) times daily.    [provider]  divalproex (DEPAKOTE) 500 MG DR tablet Take 500 mg by mouth 2 (two) times daily.     [provider]  DULoxetine (CYMBALTA) 60 MG capsule Take 60 mg by mouth daily.    [provider]  fenofibrate (TRICOR) 145 MG tablet Take 145 mg by mouth daily.    [provider]  ferrous sulfate 325 (65 FE) MG tablet Take 325 mg by mouth 2 (two) times daily with a meal.    [provider]  fluticasone (FLONASE) 50 MCG/ACT nasal spray Place 1 spray into both nostrils daily.    [provider]  hydrocortisone 2.5 % cream Apply 1 application topically 2 (two) times daily.    [provider]  hydrOXYzine (VISTARIL) 25 MG capsule Take 25 mg by mouth every evening.     [provider]  ketotifen (ZADITOR) 0.025 % ophthalmic solution Place 1 drop into both eyes 2 (two) times daily.    [provider]  LINZESS 290 MCG CAPS capsule Take 290 mcg by  mouth daily before breakfast.  05/24/19   [provider]  lisinopril (ZESTRIL) 10 MG tablet Take 10 mg by mouth daily.  05/24/19   [provider]  loratadine (CLARITIN) 10 MG tablet Take 10 mg by mouth daily.    [provider]  Melatonin 3 MG TABS Take 6 mg by mouth at bedtime.     [provider]  meloxicam (MOBIC) 15 MG tablet Take 15 mg by mouth daily.    [provider]  mirtazapine (REMERON) 7.5 MG tablet Take 7.5 mg by mouth at bedtime.  07/21/19   [provider]  MYRBETRIQ 25 MG TB24 tablet Take 25 mg by mouth daily.  05/24/19    [provider]  polyethylene glycol (MIRALAX / GLYCOLAX) packet Take 17 g by mouth daily as needed for mild constipation.     [provider]  QUEtiapine (SEROQUEL XR) 300 MG 24 hr tablet Take 300 mg by mouth 2 (two) times daily.    [provider]  rizatriptan (MAXALT) 10 MG tablet Take 10 mg by mouth as needed for migraine. May repeat in 2 hours if needed    [provider]  SEREVENT DISKUS 50 MCG/DOSE diskus inhaler Inhale 2 puffs into the lungs daily.  05/24/19   [provider]  varenicline (CHANTIX) 1 MG tablet Take 1 mg by mouth daily.    [provider]    Allergies  Allergen Reactions  . Bee Venom Other (See Comments)    Throat swelling  . Coconut Oil Anaphylaxis  . Eggs Or Egg-Derived Products Rash    Throat swelling   . Penicillin G Rash    Has patient had a PCN reaction causing immediate rash, facial/tongue/throat swelling, SOB or lightheadedness with hypotension: Yes Has patient had a PCN reaction causing severe rash involving mucus membranes or skin necrosis: No Has patient had a PCN reaction that required hospitalization: Yes Has patient had a PCN reaction occurring within the last 10 years: Yes If all of the above answers are "NO", then may proceed with Cephalosporin use.   . Tetanus Toxoids Swelling    Pt cannot have due to egg allergy  . Phenytoin Sodium Extended     Other reaction(s): Unknown    History reviewed. No pertinent family history.  Social History Social History   Tobacco Use  . Smoking status: Current Every Day Smoker    Packs/day: 0.50    Types: Cigarettes  . Smokeless tobacco: Never Used  Substance Use Topics  . Alcohol use: No  . Drug use: Never    Review of Systems Constitutional: Negative for fever. Cardiovascular: Negative for chest pain. Respiratory: Negative for shortness of breath. Gastrointestinal: Negative for abdominal pain Musculoskeletal: Right shoulder, left knee  pain Neurological: Negative for headache All other ROS negative  ____________________________________________   PHYSICAL EXAM:  VITAL SIGNS: ED Triage Vitals  Enc Vitals Group     BP 04/29/20 1605 131/88     Pulse Rate 04/29/20 1605 94     Resp 04/29/20 1605 20     Temp 04/29/20 1605 99.1 F (37.3 C)     Temp Source 04/29/20 1605 Oral     SpO2 04/29/20 1605 94 %     Weight 04/29/20 1604 195 lb (88.5 kg)     Height 04/29/20 1604 5\' 2"  (1.575 m)     Head Circumference --      Peak Flow --      Pain Score 04/29/20 1601 10  Pain Loc --      Pain Edu? --      Excl. in [place]? --     Constitutional: Alert, no acute distress. Eyes: Normal exam ENT      Head: Normocephalic and atraumatic.      Mouth/Throat: Mucous membranes are moist. Cardiovascular: Normal rate, regular rhythm.  Respiratory: Normal respiratory effort without tachypnea nor retractions. Breath sounds are clear  Gastrointestinal: Soft and nontender. No distention.  Musculoskeletal: Right shoulder/left knee pain/tenderness palpation. Neurologic:  Normal speech and language. No gross focal neurologic deficits Skin:  Skin is warm, dry and intact.  Psychiatric: Mood and affect are normal.   ____________________________________________   INITIAL IMPRESSION / ASSESSMENT AND PLAN / ED COURSE  Pertinent labs & imaging results that were available during my care of the patient were reviewed by me and considered in my medical decision making (see chart for details).   Patient presents emergency department for homelessness.  Was discharged yesterday after being seen and cleared medically and psychiatrically.  Patient has no vertigo and no one to pick her up.  Daughter does not feel comfortable safe with the patient living at the house is very strong children in the home with her.  We will have social work and PT evaluate the patient for possible placement.  Patient states she takes tramadol at home for shoulder knee pain.   We will order 50 mg every 8 hours as needed while she is awaiting social work placement.  Shelly Anderson was evaluated in Emergency Department on 04/29/2020 for the symptoms described in the history of present illness. She was evaluated in the context of the global COVID-19 pandemic, which necessitated consideration that the patient might be at risk for infection with the SARS-CoV-2 virus that causes COVID-19. Institutional protocols and algorithms that pertain to the evaluation of patients at risk for COVID-19 are in a state of rapid change based on information released by regulatory bodies including the CDC and federal and state organizations. These policies and algorithms were followed during the patient's care in the ED.  ____________________________________________   FINAL CLINICAL IMPRESSION(S) / ED DIAGNOSES  Homelessness   Harvest Dark, MD 04/29/20 1946

## 2020-04-29 NOTE — ED Notes (Signed)
IVC paperwork has been receded.

## 2020-04-29 NOTE — ED Notes (Signed)
Pt's daughter called and requested to speak with pt, pt given phone

## 2020-04-29 NOTE — ED Provider Notes (Signed)
Patient was cleared by the psychiatric team and IVC was rescinded by them.  Labs reviewed and there was no other acute concerns except for patient was positive for multiple drugs.  At this time patient will be discharged home   Concha Se, MD 04/29/20 1215
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
## 2020-04-29 NOTE — ED Notes (Signed)
Pt given meal tray.
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
## 2020-04-29 NOTE — ED Triage Notes (Signed)
Pt's care discussed with EDP, per EDP, no blood work needed.

## 2020-04-29 NOTE — ED Notes (Signed)
Report to include Situation, Background, Assessment, and Recommendations received from [facility]. Patient alert and, warm and dry, in no acute distress. Patient denies SI, AVH and pain.Patient states she wants to hurt her daughter. Patient made aware of Q15 minute rounds and security cameras for their safety. Patient instructed to come to me with needs or concerns.

## 2020-04-30 LAB — SARS CORONAVIRUS 2 BY RT PCR (HOSPITAL ORDER, PERFORMED IN ~~LOC~~ HOSPITAL LAB): SARS Coronavirus 2: NEGATIVE

## 2020-04-30 MED ORDER — LORAZEPAM 0.5 MG PO TABS: 0.5000 mg | ORAL_TABLET | Freq: Two times a day (BID) | ORAL | Status: DC

## 2020-04-30 MED ORDER — FLUTICASONE PROPIONATE 50 MCG/ACT NA SUSP
2.0000 | Freq: Every day | NASAL | Status: DC
  Filled 2020-04-30: qty 16

## 2020-04-30 MED ORDER — LINACLOTIDE 290 MCG PO CAPS
290.0000 ug | ORAL_CAPSULE | Freq: Every day | ORAL | Status: DC
  Filled 2020-04-30: qty 1

## 2020-04-30 MED ORDER — HYDROCORTISONE 1 % EX CREA
1.0000 "application " | TOPICAL_CREAM | Freq: Four times a day (QID) | CUTANEOUS | Status: DC
  Filled 2020-04-30: qty 28

## 2020-04-30 MED ORDER — MIRABEGRON ER 25 MG PO TB24
25.0000 mg | ORAL_TABLET | Freq: Every day | ORAL | Status: DC
  Filled 2020-04-30: qty 1

## 2020-04-30 MED ORDER — POLYETHYLENE GLYCOL 3350 17 G PO PACK: 17.0000 g | PACK | Freq: Every day | ORAL | Status: DC | PRN

## 2020-04-30 MED ORDER — QUETIAPINE FUMARATE ER 200 MG PO TB24
400.0000 mg | ORAL_TABLET | Freq: Every day | ORAL | Status: DC
  Filled 2020-04-30: qty 2

## 2020-04-30 MED ORDER — VITAMIN D3 25 MCG (1000 UNIT) PO TABS
5000.0000 [IU] | ORAL_TABLET | Freq: Every day | ORAL | Status: DC
  Filled 2020-04-30: qty 5

## 2020-04-30 MED ORDER — CYCLOBENZAPRINE HCL 10 MG PO TABS: 10.0000 mg | ORAL_TABLET | Freq: Every evening | ORAL | Status: DC | PRN

## 2020-04-30 MED ORDER — MELOXICAM 7.5 MG PO TABS
15.0000 mg | ORAL_TABLET | Freq: Every day | ORAL | Status: DC
  Filled 2020-04-30: qty 2

## 2020-04-30 MED ORDER — BUTALBITAL-APAP-CAFFEINE 50-325-40 MG PO TABS
1.0000 | ORAL_TABLET | ORAL | Status: DC | PRN
  Administered 2020-04-30: 1 via ORAL
  Filled 2020-04-30: qty 1

## 2020-04-30 MED ORDER — LISINOPRIL 5 MG PO TABS
10.0000 mg | ORAL_TABLET | Freq: Every day | ORAL | Status: DC
  Administered 2020-04-30: 10 mg via ORAL
  Filled 2020-04-30: qty 1

## 2020-04-30 MED ORDER — LORATADINE 10 MG PO TABS
10.0000 mg | ORAL_TABLET | Freq: Every day | ORAL | Status: DC
  Administered 2020-04-30: 10 mg via ORAL
  Filled 2020-04-30: qty 1

## 2020-04-30 MED ORDER — ATORVASTATIN CALCIUM 20 MG PO TABS: 40.0000 mg | ORAL_TABLET | Freq: Every day | ORAL | Status: DC

## 2020-04-30 MED ORDER — ALBUTEROL SULFATE HFA 108 (90 BASE) MCG/ACT IN AERS
1.0000 | INHALATION_SPRAY | Freq: Four times a day (QID) | RESPIRATORY_TRACT | Status: DC | PRN
  Filled 2020-04-30: qty 6.7

## 2020-04-30 MED ORDER — MIRTAZAPINE 15 MG PO TABS: 7.5000 mg | ORAL_TABLET | Freq: Every day | ORAL | Status: DC

## 2020-04-30 MED ORDER — HYDROXYZINE HCL 50 MG PO TABS
50.0000 mg | ORAL_TABLET | Freq: Three times a day (TID) | ORAL | Status: DC
  Filled 2020-04-30 (×2): qty 1

## 2020-04-30 MED ORDER — DULOXETINE HCL 60 MG PO CPEP
60.0000 mg | ORAL_CAPSULE | Freq: Every day | ORAL | Status: DC
  Administered 2020-04-30: 17:00:00 60 mg via ORAL
  Filled 2020-04-30: qty 1

## 2020-04-30 MED ORDER — NICOTINE 21 MG/24HR TD PT24
21.0000 mg | MEDICATED_PATCH | Freq: Once | TRANSDERMAL | Status: DC
  Filled 2020-04-30: qty 1

## 2020-04-30 NOTE — ED Notes (Signed)
Hourly rounding reveals patient sleeping in room. No complaints, stable, in no acute distress. Q15 minute rounds and monitoring via Rover and Officer to continue.  

## 2020-04-30 NOTE — ED Notes (Signed)
VOL/Pending CSW Placement/Moved to BHU-4

## 2020-04-30 NOTE — Discharge Instructions (Addendum)
Please seek medical attention and help for any thoughts about wanting to harm yourself, harm others, any concerning change in behavior, severe depression, inappropriate drug use or any other new or concerning symptoms. ° °

## 2020-04-30 NOTE — ED Notes (Signed)
VOL/Pending CSW Placement °

## 2020-04-30 NOTE — ED Notes (Signed)
Meal tray provided.

## 2020-04-30 NOTE — TOC Progression Note (Signed)
Transition of Care [facility]) - Progression Note    Patient Details  Name: Shelly Anderson MRN: [id number] Date of Birth: 1971/05/10  [facility]) CM/SW Contact  Marina Goodell Phone Number: [phone] 04/30/2020, 12:20 PM  Clinical Narrative:    This CSW spoke with patient's daughter Shelly Anderson and when this CSW asked if she would consider taking her mother back home, she stated "Hell no. She's a danger to the children."  Ms. Toni Arthurs stated her mother has "burned all her bridges and and no one want to deal with her."  Ms. Toni Arthurs states her mother has been to several groups homes in the area and has either left or been asked to leave because of her erratic, aggressive behavior and non-compliance for medication.    This CSW contacted [facility] APS/DSS for an update on this patient's case and who the case is assigned to.    This CSW left a voicemail and is waiting for a call.  This CSW left a Engineer, technical sales for [employer], and [employer] [place] to assist w/ placement. [facility] does not have placement.  [employer] does not have placement for women.         Expected Discharge Plan and Services                                                 Social Determinants of Health (SDOH) Interventions    Readmission Risk Interventions No flowsheet data found.

## 2020-04-30 NOTE — TOC Progression Note (Signed)
Transition of Care [facility]) - Progression Note    Patient Details  Name: [name] MRN: [id number] Date of Birth: 04/28/1971  Transition of Care [facility]) CM/SW Contact  Marina Goodell Phone Number: [phone] 04/30/2020, 10:24 AM  Clinical Narrative:     This CSW received a call from [facility]/ED, RN Megan. This CSW explained to [employer], that I was working inpatient at [employer] and [facility], and was not covering the ED. Aundra Millet updated this CSW about the patient status, stating the patient was psychiatrically cleared and her daughter was not willing to allow her back home.  Aundra Millet stated the patient wanted a taxi voucher to go to her daughter's house but her daughter did not want her to come home.  This CSW explained to [facility] RN since the patient was psychiatrically cleared and did not have a guardian she was competent to make her own decisions.  Megan RN stated the patient did not have anywhere to go.  This CSW told Aundra Millet RN that if she was concerned for the patient's safety, whether she would be a harm to herself or others, she would need to contact psych and ask them to reevaluate the patient. This CSW also spoke with Aundra Millet, RN about reaching out to APS and reporting the issue.  Megan RN stated APS was going to visit the daughter's house on Tuesday, but the daughter did not want the parent to return.  This CSW suggested that [place] reach out to [place] RN/CM who was covering [facility]/ED and update her on patient needs.  Megan RN verbalized understanding.       Expected Discharge Plan and Services                                                 Social Determinants of Health (SDOH) Interventions    Readmission Risk Interventions No flowsheet data found.

## 2020-04-30 NOTE — ED Notes (Signed)
Pt ambulated to bathroom 

## 2020-04-30 NOTE — ED Notes (Signed)
Pt denies SI/HI/AVH at time of assessment. Pt states she just wants to leave and go outside to smoke a cigarette. She does not want to be here. She says she's been living in a motel and she wants to return there. Tearful during assessment saying she thought she could leave when she was being brought here. Pt is voluntary.

## 2020-04-30 NOTE — ED Provider Notes (Signed)
Emergency Medicine Observation Re-evaluation Note  Shelly Anderson is a 49 y.o. female, seen on rounds today.  Pt initially presented to the ED for complaints of Homeless and Medication Refill Currently, the patient is resting in no acute distress.  Physical Exam  BP 120/78 (BP Location: Right Arm)   Pulse 80   Temp 99.1 F (37.3 C) (Oral)   Resp 18   Ht 5\' 2"  (1.575 m)   Wt 88.5 kg   SpO2 97%   BMI 35.67 kg/m  Physical Exam  ED Course / MDM  EKG:    I have reviewed the labs performed to date as well as medications administered while in observation.  Recent changes in the last 24 hours include no events overnight.   Plan  Current plan is for social work assistance for placement. Patient is not under full IVC at this time.   , MD 04/30/20 [phone]

## 2020-04-30 NOTE — Evaluation (Signed)
Physical Therapy Evaluation Patient Details Name: Shelly Anderson MRN: [id number] DOB: 03/03/1971 Today's Date: 04/30/2020   History of Present Illness  Pt is a 49 y.o. female with a past medical history of anemia, TBI, COPD, diabetes, gastric reflux, neuropathy, presents to the emergency department for a fall with imaging negative for acute findings.  Per MD pt presented to the ED for essentially homelessness.    Clinical Impression  Pt pleasant and motivated to participate during the session.  Pt was Ind with all functional tasks including amb without an AD.  Pt reported R shoulder and R knee pain as chronic and that she rarely uses her QC for pain control.  During ambulation in the ED the patient carried her QC at her side stating that she did not require to use it.  Pt steady with amb including during sharp turns and start/stops and presented with good stability with standing reaching outside BOS without UE support.  Pt reported that R knee pain and antalgic gait pattern is chronic from a knee injury that she received years ago.  Pt reports feeling that she is at her baseline physically with no skilled PT needs identified at this time.  Will complete PT orders at this time but will reassess pt pending a change in status upon receipt of new PT orders.       Follow Up Recommendations No PT follow up    Equipment Recommendations  None recommended by PT    Recommendations for Other Services       Precautions / Restrictions Precautions Precautions: None Restrictions Weight Bearing Restrictions: No      Mobility  Bed Mobility Overal bed mobility: Independent             General bed mobility comments: Good speed and effort with all bed mobility tasks  Transfers Overall transfer level: Independent Equipment used: None             General transfer comment: Good eccentric and concentric control and stability  Ambulation/Gait Ambulation/Gait assistance:  Independent Gait Distance (Feet): 80 Feet Assistive device: None Gait Pattern/deviations: Antalgic;Decreased stance time - right     General Gait Details: Good stability with antalgic gait pattern on the RLE that pt reported as being chronic from knee injury many years ago; pt carried QC at her side stating she did not need it  Careers information officer    Modified Rankin (Stroke Patients Only)       Balance Overall balance assessment: No apparent balance deficits (not formally assessed)                                           Pertinent Vitals/Pain Pain Assessment: 0-10 Pain Score: 5  Pain Location: R shoulder and R knee, chronic per patient Pain Descriptors / Indicators: Sore Pain Intervention(s): Premedicated before session;Monitored during session    Home Living Family/patient expects to be discharged to:: Unsure                 Additional Comments: Per chart review pt had been living with sister in [place] state and daughter locally niether of whom are willing to take patient in at this time    Prior Function Level of Independence: Independent with assistive device(s)         Comments: Ind amb limited community  distances mostly without an AD but with occasional use of QC as needed secondary to chronic R knee pain     Hand Dominance        Extremity/Trunk Assessment   Upper Extremity Assessment Upper Extremity Assessment: Overall WFL for tasks assessed    Lower Extremity Assessment Lower Extremity Assessment: Overall WFL for tasks assessed       Communication   Communication: No difficulties  Cognition Arousal/Alertness: Awake/alert Behavior During Therapy: WFL for tasks assessed/performed Overall Cognitive Status: Within Functional Limits for tasks assessed                                        General Comments General comments (skin integrity, edema, etc.): Pt steady with dynamic  gait tasks as well as with reaching outside BOS    Exercises     Assessment/Plan    PT Assessment Patent does not need any further PT services  PT Problem List         PT Treatment Interventions      PT Goals (Current goals can be found in the Care Plan section)  Acute Rehab PT Goals PT Goal Formulation: All assessment and education complete, DC therapy    Frequency     Barriers to discharge        Co-evaluation               AM-PAC PT "6 Clicks" Mobility  Outcome Measure Help needed turning from your back to your side while in a flat bed without using bedrails?: None Help needed moving from lying on your back to sitting on the side of a flat bed without using bedrails?: None Help needed moving to and from a bed to a chair (including a wheelchair)?: None Help needed standing up from a chair using your arms (e.g., wheelchair or bedside chair)?: None Help needed to walk in hospital room?: None Help needed climbing 3-5 steps with a railing? : None 6 Click Score: 24    End of Session   Activity Tolerance: Patient tolerated treatment well Patient left: in bed Nurse Communication: Mobility status PT Visit Diagnosis: Muscle weakness (generalized) (M62.81);Pain Pain - Right/Left: Right Pain - part of body: Shoulder    Time: 5631-4970 PT Time Calculation (min) (ACUTE ONLY): 15 min   Charges:   PT Evaluation $PT Eval Low Complexity: 1 Low          D. Royetta Asal PT, DPT 04/30/20, 11:47 AM

## 2020-04-30 NOTE — ED Notes (Signed)
Sandwich tray was given and a drink 

## 2020-04-30 NOTE — ED Notes (Signed)
Home medications have been re-ordered.

## 2020-05-01 ENCOUNTER — Telehealth: Payer: Self-pay | Admitting: Emergency Medicine

## 2020-05-01 NOTE — Telephone Encounter (Signed)
Daughter called asking for covid result so they can get ms Yusko into a facility.  Patient was also on phone and she agrees for me to tell her daughter result which was negative.

## 2020-05-14 NOTE — Congregational Nurse Program (Signed)
Saw client at the [facility] clinic. She states she is in need of medication refills and needs to connect wit a behavioral mental health specialist in [place]. She had [employer] in [place] but is now out of network. Will assist her with application for medication assistance, and [facility]. She wil contact [employer] to reapply for out of state [employer]. Vital signs obtained and will assist with appointments. She has an appointment with [facility] this Thursday 5/27.

## 2022-04-02 IMAGING — CR DG KNEE COMPLETE 4+V*L*
1 series · 4 of 4 positions shown · non-contrast
Comparison: May 14, 2018.

CLINICAL DATA: Pain

EXAM:
LEFT KNEE - COMPLETE 4+ VIEW

[Series 1: dg knee complete 4 views left · 0.14mm/px · 4 of 4 slices shown]
[im 1/4]
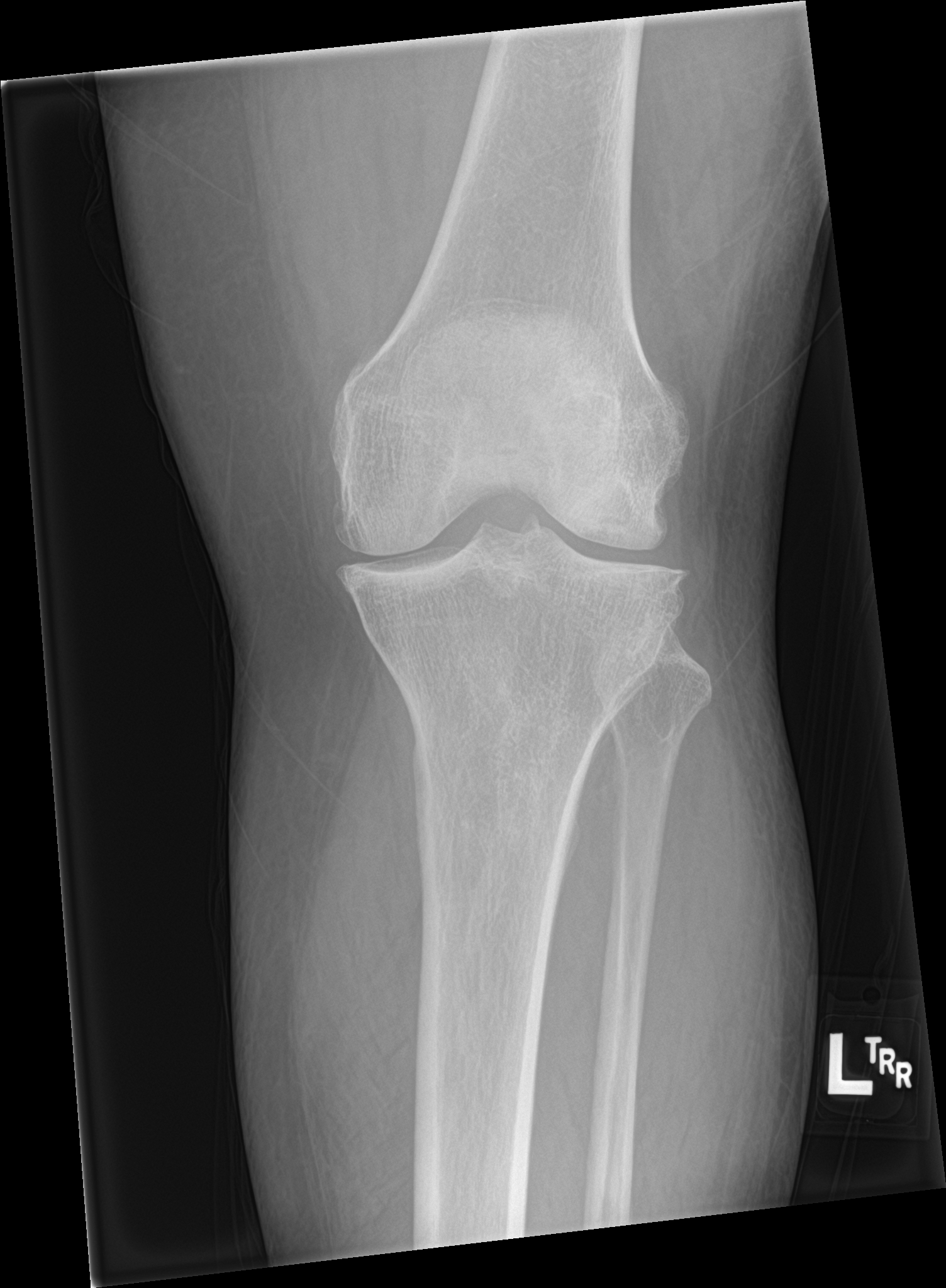
[im 2/4]
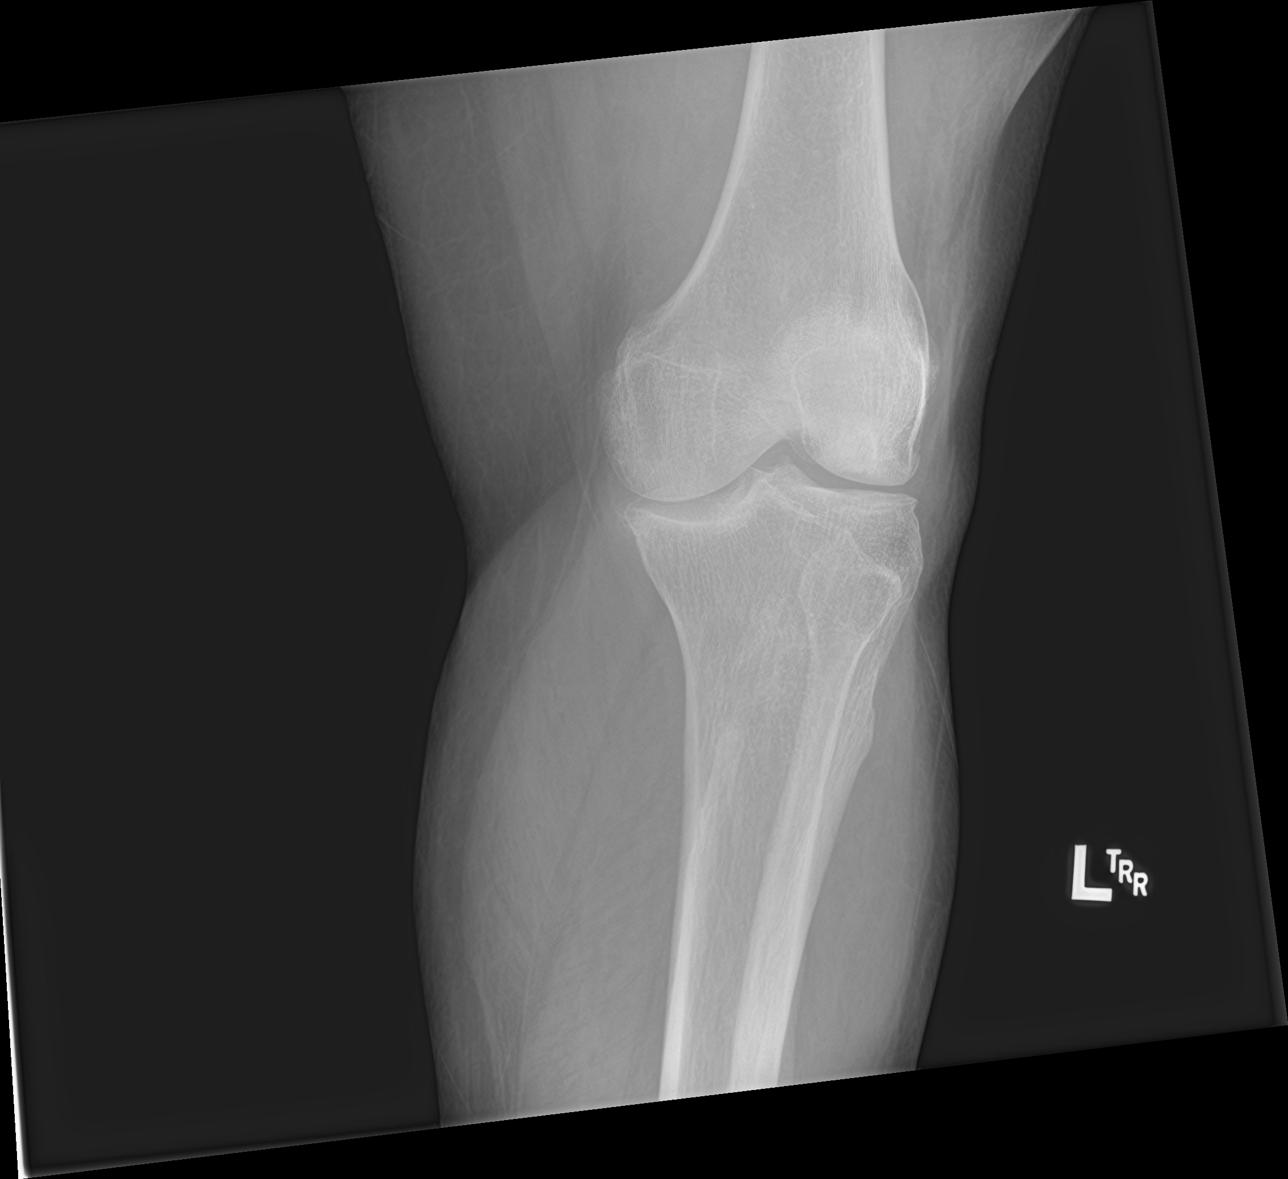
[im 3/4]
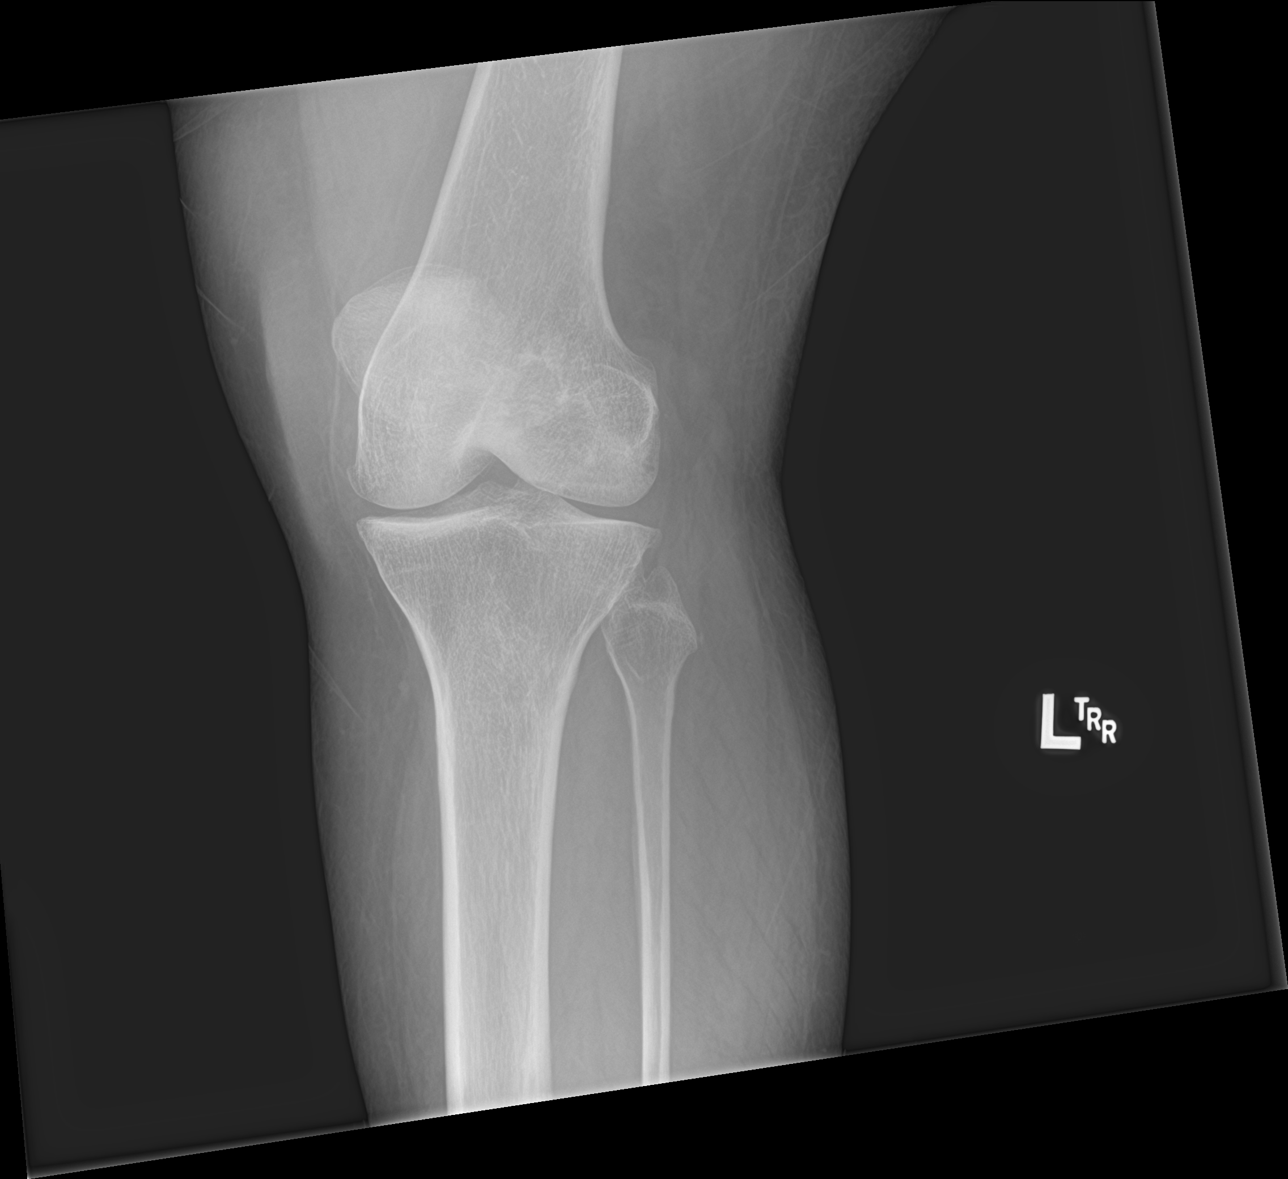
[im 4/4]
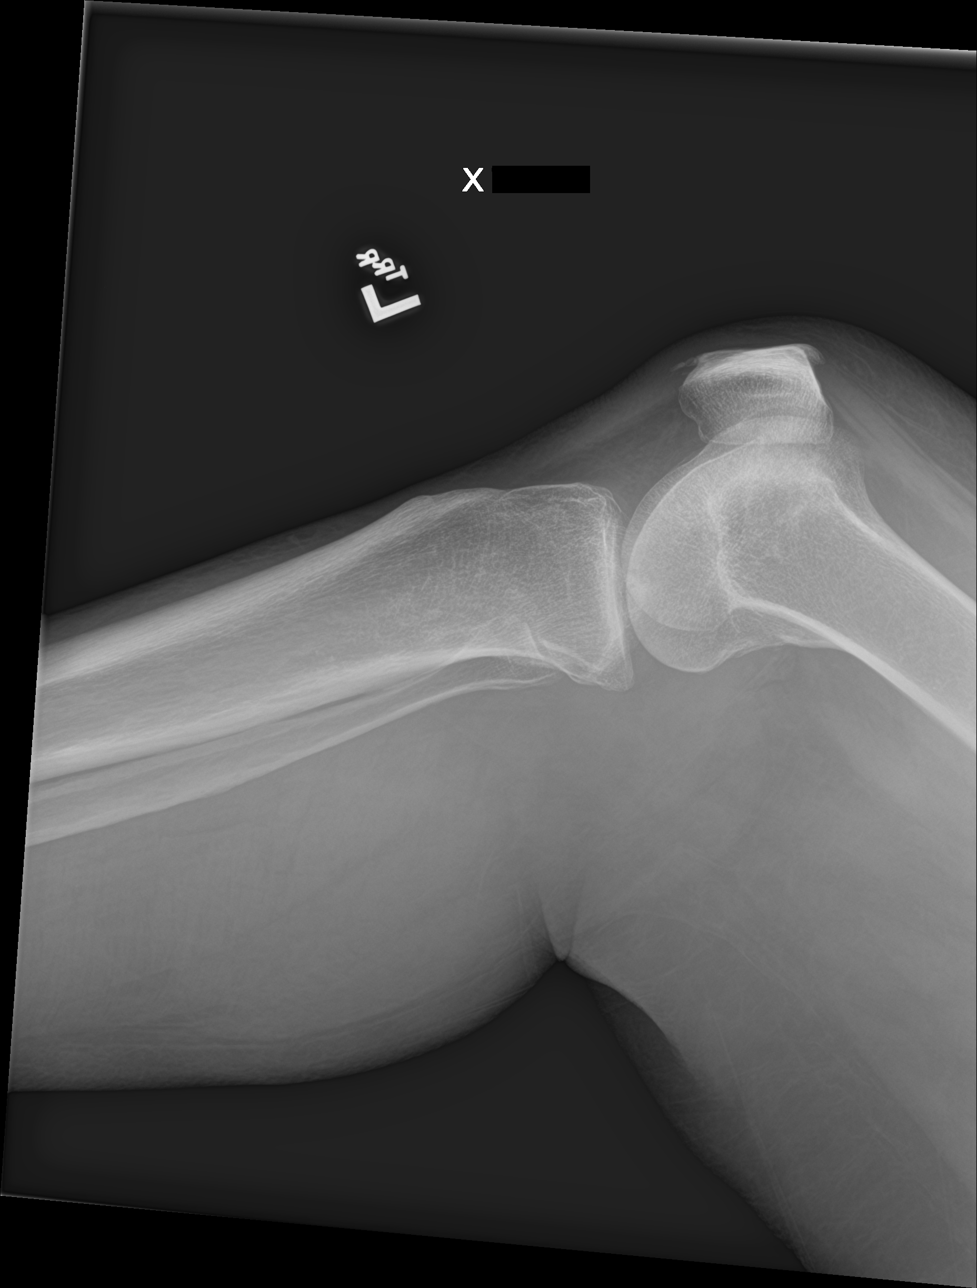

[4 of 4 positions shown; findings below may reference images not displayed]

FINDINGS: Mild tricompartmental degenerative changes are again noted of the
knee. There is no acute displaced fracture. There is no dislocation.
No significant joint effusion.
IMPRESSION: No acute abnormality. Mild tricompartmental degenerative changes are
again noted.
